# Patient Record
Sex: Male | Born: 1980 | Race: White | Hispanic: No | Marital: Single | Smoking: Current every day smoker
Health system: Southern US, Community
[De-identification: ages and names within clinical notes are randomized; demographics above are authoritative.]

## PROBLEM LIST (undated history)

## (undated) DIAGNOSIS — F1020 Alcohol dependence, uncomplicated: Secondary | ICD-10-CM

## (undated) DIAGNOSIS — I8500 Esophageal varices without bleeding: Secondary | ICD-10-CM

## (undated) DIAGNOSIS — F17200 Nicotine dependence, unspecified, uncomplicated: Secondary | ICD-10-CM

## (undated) DIAGNOSIS — K7030 Alcoholic cirrhosis of liver without ascites: Secondary | ICD-10-CM

## (undated) HISTORY — DX: Alcoholic cirrhosis of liver without ascites: K70.30

## (undated) HISTORY — PX: NO PAST SURGERIES: SHX2092

## (undated) HISTORY — DX: Esophageal varices without bleeding: I85.00

---

## 2009-02-20 ENCOUNTER — Ambulatory Visit: Payer: Self-pay | Admitting: Diagnostic Radiology

## 2009-02-20 ENCOUNTER — Emergency Department (HOSPITAL_BASED_OUTPATIENT_CLINIC_OR_DEPARTMENT_OTHER): Admission: EM | Admit: 2009-02-20 | Discharge: 2009-02-20 | Payer: Self-pay | Admitting: Emergency Medicine

## 2021-06-11 ENCOUNTER — Emergency Department (HOSPITAL_COMMUNITY): Payer: 59

## 2021-06-11 ENCOUNTER — Encounter (HOSPITAL_COMMUNITY): Payer: Self-pay | Admitting: Emergency Medicine

## 2021-06-11 ENCOUNTER — Other Ambulatory Visit: Payer: Self-pay

## 2021-06-11 ENCOUNTER — Inpatient Hospital Stay (HOSPITAL_COMMUNITY)
Admission: EM | Admit: 2021-06-11 | Discharge: 2021-06-15 | DRG: 433 | Disposition: A | Discharge status: Home / Self Care | Payer: 59 | Attending: Internal Medicine | Admitting: Internal Medicine

## 2021-06-11 DIAGNOSIS — F17200 Nicotine dependence, unspecified, uncomplicated: Secondary | ICD-10-CM | POA: Diagnosis not present

## 2021-06-11 DIAGNOSIS — R9431 Abnormal electrocardiogram [ECG] [EKG]: Secondary | ICD-10-CM | POA: Diagnosis present

## 2021-06-11 DIAGNOSIS — E860 Dehydration: Secondary | ICD-10-CM | POA: Diagnosis present

## 2021-06-11 DIAGNOSIS — D539 Nutritional anemia, unspecified: Secondary | ICD-10-CM | POA: Diagnosis not present

## 2021-06-11 DIAGNOSIS — D689 Coagulation defect, unspecified: Secondary | ICD-10-CM | POA: Diagnosis present

## 2021-06-11 DIAGNOSIS — K7040 Alcoholic hepatic failure without coma: Principal | ICD-10-CM | POA: Diagnosis present

## 2021-06-11 DIAGNOSIS — D72829 Elevated white blood cell count, unspecified: Secondary | ICD-10-CM | POA: Diagnosis present

## 2021-06-11 DIAGNOSIS — K7031 Alcoholic cirrhosis of liver with ascites: Secondary | ICD-10-CM | POA: Diagnosis present

## 2021-06-11 DIAGNOSIS — E1065 Type 1 diabetes mellitus with hyperglycemia: Secondary | ICD-10-CM | POA: Diagnosis not present

## 2021-06-11 DIAGNOSIS — K766 Portal hypertension: Secondary | ICD-10-CM | POA: Diagnosis not present

## 2021-06-11 DIAGNOSIS — K7011 Alcoholic hepatitis with ascites: Secondary | ICD-10-CM | POA: Diagnosis present

## 2021-06-11 DIAGNOSIS — E538 Deficiency of other specified B group vitamins: Secondary | ICD-10-CM | POA: Diagnosis present

## 2021-06-11 DIAGNOSIS — R5081 Fever presenting with conditions classified elsewhere: Secondary | ICD-10-CM

## 2021-06-11 DIAGNOSIS — I864 Gastric varices: Secondary | ICD-10-CM | POA: Diagnosis present

## 2021-06-11 DIAGNOSIS — E872 Acidosis, unspecified: Secondary | ICD-10-CM | POA: Diagnosis not present

## 2021-06-11 DIAGNOSIS — E8770 Fluid overload, unspecified: Secondary | ICD-10-CM | POA: Diagnosis present

## 2021-06-11 DIAGNOSIS — R69 Illness, unspecified: Secondary | ICD-10-CM | POA: Diagnosis not present

## 2021-06-11 DIAGNOSIS — K7200 Acute and subacute hepatic failure without coma: Secondary | ICD-10-CM | POA: Diagnosis present

## 2021-06-11 DIAGNOSIS — Z880 Allergy status to penicillin: Secondary | ICD-10-CM | POA: Diagnosis not present

## 2021-06-11 DIAGNOSIS — R000 Tachycardia, unspecified: Secondary | ICD-10-CM | POA: Diagnosis not present

## 2021-06-11 DIAGNOSIS — Z91013 Allergy to seafood: Secondary | ICD-10-CM | POA: Diagnosis not present

## 2021-06-11 DIAGNOSIS — Z20822 Contact with and (suspected) exposure to covid-19: Secondary | ICD-10-CM | POA: Diagnosis not present

## 2021-06-11 DIAGNOSIS — I700 Atherosclerosis of aorta: Secondary | ICD-10-CM | POA: Diagnosis not present

## 2021-06-11 DIAGNOSIS — I8510 Secondary esophageal varices without bleeding: Secondary | ICD-10-CM | POA: Diagnosis present

## 2021-06-11 DIAGNOSIS — F17210 Nicotine dependence, cigarettes, uncomplicated: Secondary | ICD-10-CM | POA: Diagnosis present

## 2021-06-11 DIAGNOSIS — D6959 Other secondary thrombocytopenia: Secondary | ICD-10-CM | POA: Diagnosis present

## 2021-06-11 DIAGNOSIS — R17 Unspecified jaundice: Secondary | ICD-10-CM

## 2021-06-11 DIAGNOSIS — F1020 Alcohol dependence, uncomplicated: Secondary | ICD-10-CM | POA: Diagnosis present

## 2021-06-11 DIAGNOSIS — R188 Other ascites: Secondary | ICD-10-CM | POA: Diagnosis not present

## 2021-06-11 DIAGNOSIS — R509 Fever, unspecified: Secondary | ICD-10-CM | POA: Diagnosis present

## 2021-06-11 DIAGNOSIS — Z91011 Allergy to milk products: Secondary | ICD-10-CM

## 2021-06-11 DIAGNOSIS — E871 Hypo-osmolality and hyponatremia: Secondary | ICD-10-CM | POA: Diagnosis not present

## 2021-06-11 DIAGNOSIS — K760 Fatty (change of) liver, not elsewhere classified: Secondary | ICD-10-CM | POA: Diagnosis not present

## 2021-06-11 DIAGNOSIS — R1011 Right upper quadrant pain: Secondary | ICD-10-CM | POA: Diagnosis not present

## 2021-06-11 DIAGNOSIS — E876 Hypokalemia: Secondary | ICD-10-CM | POA: Diagnosis not present

## 2021-06-11 DIAGNOSIS — Z79899 Other long term (current) drug therapy: Secondary | ICD-10-CM | POA: Diagnosis not present

## 2021-06-11 DIAGNOSIS — R160 Hepatomegaly, not elsewhere classified: Secondary | ICD-10-CM | POA: Diagnosis not present

## 2021-06-11 HISTORY — DX: Nicotine dependence, unspecified, uncomplicated: F17.200

## 2021-06-11 HISTORY — DX: Alcohol dependence, uncomplicated: F10.20

## 2021-06-11 LAB — ETHANOL: Alcohol, Ethyl (B): <10 mg/dL (ref <10)

## 2021-06-11 LAB — RAPID URINE DRUG SCREEN, HOSP PERFORMED
Amphetamines: NOT DETECTED
Barbiturates: NOT DETECTED
Benzodiazepines: NOT DETECTED
Cocaine: NOT DETECTED
Opiates: NOT DETECTED
Tetrahydrocannabinol: NOT DETECTED

## 2021-06-11 LAB — BASIC METABOLIC PANEL
Anion gap: 13 (ref 5–15)
BUN: 5 mg/dL — ABNORMAL LOW (ref 6–20)
CO2: 34 mmol/L — ABNORMAL HIGH (ref 22–32)
Calcium: 7.7 mg/dL — ABNORMAL LOW (ref 8.9–10.3)
Chloride: 80 mmol/L — ABNORMAL LOW (ref 98–111)
Creatinine, Ser: 0.71 mg/dL (ref 0.61–1.24)
GFR, Estimated: >60 mL/min (ref >60)
Glucose, Bld: 120 mg/dL — ABNORMAL HIGH (ref 70–99)
Potassium: 2.4 mmol/L — CL (ref 3.5–5.1)
Sodium: 127 mmol/L — ABNORMAL LOW (ref 135–145)

## 2021-06-11 LAB — URINALYSIS, ROUTINE W REFLEX MICROSCOPIC
Cellular Cast, UA: 40
Glucose, UA: 50 mg/dL — AB
Ketones, ur: NEGATIVE mg/dL
Leukocytes,Ua: NEGATIVE
Nitrite: NEGATIVE
Protein, ur: 30 mg/dL — AB
Specific Gravity, Urine: 1.021 (ref 1.005–1.030)
pH: 5 (ref 5.0–8.0)

## 2021-06-11 LAB — CBC WITH DIFFERENTIAL/PLATELET
Abs Immature Granulocytes: 0.23 10*3/uL — ABNORMAL HIGH (ref 0.00–0.07)
Basophils Absolute: 0 10*3/uL (ref 0.0–0.1)
Basophils Relative: 0 %
Eosinophils Absolute: 0 10*3/uL (ref 0.0–0.5)
Eosinophils Relative: 0 %
HCT: 31.5 % — ABNORMAL LOW (ref 39.0–52.0)
Hemoglobin: 11.5 g/dL — ABNORMAL LOW (ref 13.0–17.0)
Immature Granulocytes: 1 %
Lymphocytes Relative: 6 %
Lymphs Abs: 1.1 10*3/uL (ref 0.7–4.0)
MCH: 35.2 pg — ABNORMAL HIGH (ref 26.0–34.0)
MCHC: 36.5 g/dL — ABNORMAL HIGH (ref 30.0–36.0)
MCV: 96.3 fL (ref 80.0–100.0)
Monocytes Absolute: 1.2 10*3/uL — ABNORMAL HIGH (ref 0.1–1.0)
Monocytes Relative: 6 %
Neutro Abs: 15.7 10*3/uL — ABNORMAL HIGH (ref 1.7–7.7)
Neutrophils Relative %: 87 %
Platelets: 110 10*3/uL — ABNORMAL LOW (ref 150–400)
RBC: 3.27 MIL/uL — ABNORMAL LOW (ref 4.22–5.81)
RDW: 14.9 % (ref 11.5–15.5)
WBC: 18.2 10*3/uL — ABNORMAL HIGH (ref 4.0–10.5)
nRBC: 0.2 % (ref 0.0–0.2)

## 2021-06-11 LAB — HEPATITIS PANEL, ACUTE
HCV Ab: NONREACTIVE
Hep A IgM: NONREACTIVE
Hep B C IgM: NONREACTIVE
Hepatitis B Surface Ag: NONREACTIVE

## 2021-06-11 LAB — COMPREHENSIVE METABOLIC PANEL
ALT: 74 U/L — ABNORMAL HIGH (ref 0–44)
AST: 196 U/L — ABNORMAL HIGH (ref 15–41)
Albumin: 2.2 g/dL — ABNORMAL LOW (ref 3.5–5.0)
Alkaline Phosphatase: 398 U/L — ABNORMAL HIGH (ref 38–126)
Anion gap: 18 — ABNORMAL HIGH (ref 5–15)
BUN: <5 mg/dL — ABNORMAL LOW (ref 6–20)
CO2: 33 mmol/L — ABNORMAL HIGH (ref 22–32)
Calcium: 8.8 mg/dL — ABNORMAL LOW (ref 8.9–10.3)
Chloride: 75 mmol/L — ABNORMAL LOW (ref 98–111)
Creatinine, Ser: 1.09 mg/dL (ref 0.61–1.24)
GFR, Estimated: >60 mL/min (ref >60)
Glucose, Bld: 152 mg/dL — ABNORMAL HIGH (ref 70–99)
Potassium: <2 mmol/L — CL (ref 3.5–5.1)
Sodium: 126 mmol/L — ABNORMAL LOW (ref 135–145)
Total Bilirubin: 24.5 mg/dL (ref 0.3–1.2)
Total Protein: 6.8 g/dL (ref 6.5–8.1)

## 2021-06-11 LAB — HIV ANTIBODY (ROUTINE TESTING W REFLEX): HIV Screen 4th Generation wRfx: NONREACTIVE

## 2021-06-11 LAB — RESP PANEL BY RT-PCR (FLU A&B, COVID) ARPGX2
Influenza A by PCR: NEGATIVE
Influenza B by PCR: NEGATIVE
SARS Coronavirus 2 by RT PCR: NEGATIVE

## 2021-06-11 LAB — PROTIME-INR
INR: 1.3 — ABNORMAL HIGH (ref 0.8–1.2)
INR: 1.3 — ABNORMAL HIGH (ref 0.8–1.2)
Prothrombin Time: 15.9 seconds — ABNORMAL HIGH (ref 11.4–15.2)
Prothrombin Time: 16 seconds — ABNORMAL HIGH (ref 11.4–15.2)

## 2021-06-11 LAB — MAGNESIUM: Magnesium: 1.2 mg/dL — ABNORMAL LOW (ref 1.7–2.4)

## 2021-06-11 LAB — ACETAMINOPHEN LEVEL: Acetaminophen (Tylenol), Serum: <10 ug/mL — ABNORMAL LOW (ref 10–30)

## 2021-06-11 LAB — LIPASE, BLOOD: Lipase: 41 U/L (ref 11–51)

## 2021-06-11 LAB — LACTIC ACID, PLASMA
Lactic Acid, Venous: 4.4 mmol/L (ref 0.5–1.9)
Lactic Acid, Venous: 3.4 mmol/L (ref 0.5–1.9)
Lactic Acid, Venous: 1.9 mmol/L (ref 0.5–1.9)
Lactic Acid, Venous: 2.2 mmol/L (ref 0.5–1.9)

## 2021-06-11 MED ORDER — HYDRALAZINE HCL 20 MG/ML IJ SOLN: 5.0000 mg | INTRAMUSCULAR | Status: DC | PRN

## 2021-06-11 MED ORDER — PHYTONADIONE 5 MG PO TABS
10.0000 mg | ORAL_TABLET | Freq: Once | ORAL | Status: AC
  Administered 2021-06-11: 10 mg via ORAL
  Filled 2021-06-11: qty 2

## 2021-06-11 MED ORDER — POTASSIUM CHLORIDE 10 MEQ/100ML IV SOLN
10.0000 meq | INTRAVENOUS | Status: AC
  Administered 2021-06-11: 10 meq via INTRAVENOUS

## 2021-06-11 MED ORDER — OXYCODONE HCL 5 MG PO TABS
5.0000 mg | ORAL_TABLET | ORAL | Status: DC | PRN
  Administered 2021-06-11 – 2021-06-14 (×4): 5 mg via ORAL
  Filled 2021-06-11 (×4): qty 1

## 2021-06-11 MED ORDER — THIAMINE HCL 100 MG/ML IJ SOLN: 100.0000 mg | Freq: Every day | INTRAMUSCULAR | Status: DC

## 2021-06-11 MED ORDER — MAGNESIUM SULFATE 2 GM/50ML IV SOLN
2.0000 g | Freq: Once | INTRAVENOUS | Status: AC
  Administered 2021-06-11: 2 g via INTRAVENOUS
  Filled 2021-06-11: qty 50

## 2021-06-11 MED ORDER — ONDANSETRON HCL 4 MG PO TABS: 4.0000 mg | ORAL_TABLET | Freq: Four times a day (QID) | ORAL | Status: DC | PRN

## 2021-06-11 MED ORDER — IOHEXOL 350 MG/ML SOLN
80.0000 mL | Freq: Once | INTRAVENOUS | Status: AC | PRN
  Administered 2021-06-11: 80 mL via INTRAVENOUS

## 2021-06-11 MED ORDER — DOCUSATE SODIUM 100 MG PO CAPS
100.0000 mg | ORAL_CAPSULE | Freq: Two times a day (BID) | ORAL | Status: DC
  Administered 2021-06-11 – 2021-06-13 (×6): 100 mg via ORAL
  Filled 2021-06-11 (×6): qty 1

## 2021-06-11 MED ORDER — SODIUM CHLORIDE 0.9 % IV SOLN: INTRAVENOUS | Status: DC

## 2021-06-11 MED ORDER — FOLIC ACID 1 MG PO TABS
1.0000 mg | ORAL_TABLET | Freq: Every day | ORAL | Status: DC
  Administered 2021-06-11 – 2021-06-15 (×5): 1 mg via ORAL
  Filled 2021-06-11 (×5): qty 1

## 2021-06-11 MED ORDER — SODIUM CHLORIDE 0.9 % IV SOLN
2.0000 g | INTRAVENOUS | Status: DC
  Administered 2021-06-11 – 2021-06-15 (×5): 2 g via INTRAVENOUS
  Filled 2021-06-11 (×5): qty 20

## 2021-06-11 MED ORDER — ADULT MULTIVITAMIN W/MINERALS CH
1.0000 | ORAL_TABLET | Freq: Every day | ORAL | Status: DC
  Administered 2021-06-11 – 2021-06-15 (×5): 1 via ORAL
  Filled 2021-06-11 (×5): qty 1

## 2021-06-11 MED ORDER — POLYETHYLENE GLYCOL 3350 17 G PO PACK: 17.0000 g | PACK | Freq: Every day | ORAL | Status: DC | PRN

## 2021-06-11 MED ORDER — CIPROFLOXACIN IN D5W 400 MG/200ML IV SOLN
400.0000 mg | Freq: Two times a day (BID) | INTRAVENOUS | Status: DC
  Administered 2021-06-11: 400 mg via INTRAVENOUS
  Filled 2021-06-11: qty 200

## 2021-06-11 MED ORDER — LORAZEPAM 2 MG/ML IJ SOLN: 1.0000 mg | INTRAMUSCULAR | Status: DC | PRN

## 2021-06-11 MED ORDER — ONDANSETRON HCL 4 MG/2ML IJ SOLN
4.0000 mg | Freq: Four times a day (QID) | INTRAMUSCULAR | Status: DC | PRN
Start: 2021-06-11 — End: 2021-06-15

## 2021-06-11 MED ORDER — SODIUM CHLORIDE 0.9 % IV BOLUS (SEPSIS)
1000.0000 mL | Freq: Once | INTRAVENOUS | Status: AC
  Administered 2021-06-11: 1000 mL via INTRAVENOUS

## 2021-06-11 MED ORDER — LORAZEPAM 1 MG PO TABS: 1.0000 mg | ORAL_TABLET | ORAL | Status: DC | PRN

## 2021-06-11 MED ORDER — NICOTINE 14 MG/24HR TD PT24
14.0000 mg | MEDICATED_PATCH | Freq: Every day | TRANSDERMAL | Status: DC
  Administered 2021-06-11 – 2021-06-15 (×5): 14 mg via TRANSDERMAL
  Filled 2021-06-11 (×5): qty 1

## 2021-06-11 MED ORDER — POTASSIUM CHLORIDE CRYS ER 20 MEQ PO TBCR
40.0000 meq | EXTENDED_RELEASE_TABLET | Freq: Once | ORAL | Status: AC
  Administered 2021-06-11: 40 meq via ORAL
  Filled 2021-06-11: qty 2

## 2021-06-11 MED ORDER — LACTATED RINGERS IV SOLN: INTRAVENOUS | Status: DC

## 2021-06-11 MED ORDER — THIAMINE HCL 100 MG PO TABS
100.0000 mg | ORAL_TABLET | Freq: Every day | ORAL | Status: DC
  Administered 2021-06-11 – 2021-06-15 (×5): 100 mg via ORAL
  Filled 2021-06-11 (×5): qty 1

## 2021-06-11 MED ORDER — METRONIDAZOLE 500 MG/100ML IV SOLN
500.0000 mg | Freq: Two times a day (BID) | INTRAVENOUS | Status: DC
  Administered 2021-06-11: 500 mg via INTRAVENOUS
  Filled 2021-06-11: qty 100

## 2021-06-11 MED ORDER — BISACODYL 5 MG PO TBEC: 5.0000 mg | DELAYED_RELEASE_TABLET | Freq: Every day | ORAL | Status: DC | PRN

## 2021-06-11 MED ORDER — SODIUM CHLORIDE 0.9% FLUSH
3.0000 mL | Freq: Two times a day (BID) | INTRAVENOUS | Status: DC
  Administered 2021-06-11 – 2021-06-15 (×7): 3 mL via INTRAVENOUS

## 2021-06-11 MED ORDER — POTASSIUM CHLORIDE 10 MEQ/100ML IV SOLN
10.0000 meq | INTRAVENOUS | Status: AC
  Administered 2021-06-11 (×5): 10 meq via INTRAVENOUS
  Filled 2021-06-11 (×6): qty 100

## 2021-06-11 NOTE — Assessment & Plan Note (Signed)
-  Encourage cessation.   °-This was discussed with the patient and should be reviewed on an ongoing basis.   °-Patch ordered at patient request. °

## 2021-06-11 NOTE — ED Triage Notes (Signed)
Patient with back pain/kidney pain and right flank pain.  Patient did have nausea and vomiting on Monday.  Patient states that this happened after drinking on Super bowl Sunday.  Patient is jaundiced at this time, skin is yellow and sclarea are yellow.  Patient does drink every day.  Patient drinks beer and has shots of hard liquor.  Patient last drank on Sunday night.

## 2021-06-11 NOTE — Consult Note (Signed)
[facility] Consult: 8:57 AM 06/11/2021  LOS: 0 days    Referring Provider: Dr Lorin Mercy  Primary Care Physician:  Pcp, No Previous interactions with Thomas Snyder at [facility]. Primary Gastroenterologist:  unassigned     Reason for Consultation:  ETOH hepatitis, cirrhosis, portal htn.     HPI: Thomas Snyder is a 41 y.o. male.  EMH epidermoid cyst.  Elevated LFTs tribute into alcohol 2020 with hepatitis panel negative, GGT 468.  Did not have liver imaging.  Glucose intolerance, DM 1 not on meds..  Neuropathy.  Low vitamin D 2020.  Elevated triglycerides 192 in 2020.  No prior GI or hepatology involvement.  No prior orthopedic, abdominal/pelvic surgeries  Presented to triage early yesterday morning.  Having back/right flank pain.  Nausea and vomiting nonbloody material early in the week after drinking more heavily than usual on Super Bowl Sunday.  Noted to be jaundiced.  Takes omeprazole which controls heartburn.  Periodic dark urine for several weeks if not months.  For last several days, felt well but this is nonspecific, denies abdominal pain.  No current nausea though over several months he periodically vomits when he "eats too fast/too much food at once".  Admits to daily drinking of a beer and 5 shots of liquor.  On weekends he can drink 2-3 times that amount.  His longest sobriety in recent years has been 10 days, he has been advised he should stop drinking by family.  Has never suffered from hallucinations, seizures during longer periods of sobriety.  Occasionally has swelling mostly at the sides of his abdomen but not in his lower extremities or scrotum.  Weight stable.  Starting Thursday, 3 days ago, coworker noted jaundice.  He showed up in the ED on Friday morning for evaluation.  Denies black,  bloody stools, coffee-ground or hematemesis.  Uses 1-2 regular strength Tylenol several days a week to address stiffness in his hands when he is at work.  BPs 150s over 90s at arrival, currently 120s over 80.  Heart rate as high as 134 at arrival, currently 121.  No fever.  Oxygen sats mid 90s on room air.  Lab derangements include hyponatremia 126.  Potassium less than 2.  Hypochloremia.  Glucose 152.  BUN actually low with normal creatinine.  T. bili 24.5.  Alk phos 398.  AST/ALT 196/74. WBCs 18.2.  Hb 11.5, MCV 96.  Platelets 110.  INR 1.3 Discrim Fx score 35.   EtOH level less than 10, last drink was Super Bowl Sunday, 6 days ago. Serum lactate 4.4 .. 3.4.    Abdominal ultrasound CTAP with contrast: Cirrhosis/hepatic steatosis, marked hepatomegaly changes of portal hypertension with recanalized umbilicus vein, small esophageal and gastric varices, small volume ascites.  Edema in the proximal colon with more significant edema in the mid and distal sigmoid and rectum, findings nonspecific but may represent colitis versus hepatic colopathy.  Aortic atherosclerosis. Abd US shows nonspecific gallbladder wall thickening, no cholelithiasis.  Fatty liver, hepatomegaly, cirrhosis.  No flow in the main PV may be sequela of extensive  portal hypertension but PV thrombosis not excluded.  Perihepatic ascites that may be the source for gallbladder wall thickening.  Single, lives alone.  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  Parents live in [place], he has a sister who lives in [place].  Has had 2 DUIs, the last was a 4 or 5 years ago. Family history T2DM.  Father has had several colonoscopies but the patient is not sure that he has any specific pathology.  No family history of alcohol use disorder or liver disease.     Prior to Admission medications   Medication Sig Start Date End Date Taking? Authorizing Provider  acetaminophen (TYLENOL) 325 MG tablet Take 650 mg by mouth every 6 (six)  hours as needed for moderate pain or headache.   Yes [provider]  calcium carbonate (TUMS - DOSED IN MG ELEMENTAL CALCIUM) 500 MG chewable tablet Chew 1 tablet by mouth daily as needed for indigestion or heartburn.   Yes [provider]  loratadine (CLARITIN) 10 MG tablet Take 10 mg by mouth daily.   Yes [provider]  omeprazole (PRILOSEC OTC) 20 MG tablet Take 20 mg by mouth daily.   Yes [provider]    Scheduled Meds:  Infusions:  sodium chloride 150 mL/hr at 06/11/21 0726   ciprofloxacin Stopped (06/11/21 0808)   metronidazole Stopped (06/11/21 0630)   PRN Meds:    Allergies as of 06/11/2021 - Review Complete 06/11/2021  Allergen Reaction Noted   Milk-related compounds  06/11/2021   Penicillins  06/11/2021   Shellfish allergy  07/03/2018    History reviewed. No pertinent family history.  Social History   Socioeconomic History   Marital status: Single    Spouse name: Not on file   Number of children: Not on file   Years of education: Not on file   Highest education level: Not on file  Occupational History   Not on file  Tobacco Use   Smoking status: Every Day    Types: Cigarettes   Smokeless tobacco: Never  Substance and Sexual Activity   Alcohol use: Yes    Alcohol/week: 11.0 standard drinks    Types: 6 Cans of beer, 5 Shots of liquor per week    Comment: drinks every day   Drug use: Not on file   Sexual activity: Not on file  Other Topics Concern   Not on file  Social History Narrative   Not on file   Social Determinants of Health   Financial Resource Strain: Not on file  Food Insecurity: Not on file  Transportation Needs: Not on file  Physical Activity: Not on file  Stress: Not on file  Social Connections: Not on file  Intimate Partner Violence: Not on file    REVIEW OF SYSTEMS: Constitutional: Little bit tired but nothing profound. ENT:  No nose bleeds Pulm: Denies shortness of breath.  No  cough. CV:  No palpitations, no LE edema.  No angina GU:  No hematuria, no frequency GI: See HPI. Heme: Denies unusual or excessive bleeding or bruising. Transfusions: None Neuro:  No headaches, no peripheral tingling or numbness.  No syncope, no seizures. Derm:  No itching, no rash or sores.  Endocrine:  No sweats or chills.  No polyuria or dysuria Immunization: Not queried. Travel:  Not queried.   PHYSICAL EXAM: Vital signs in last 24 hours: Vitals:   06/11/21 0501 06/11/21 0730  BP: (!) 132/92 128/80  Pulse: (!) 125 (!) 121  Resp: 18  17  Temp: 99.7 F (37.6 C)   SpO2: 96% 95%   Wt Readings from Last 3 Encounters:  No data found for Wt    General: Patient is jaundiced and icteric.  Comfortable.  His jaundice makes him look ill but otherwise does not look acutely ill. Head: No facial asymmetry or swelling.  No signs of head trauma. Eyes: Sclera icteric.  Conjunctiva pink. Ears: No hearing deficit Nose: No congestion or discharge Mouth: Fair dentition.  Tongue midline.  Mucosa pink, moist, clear. Neck: No JVD, no masses, no thyromegaly Lungs: Some fine crackles in the bases otherwise clear.  No labored breathing or cough Heart: Tacky.  No MRG.  S1, S2 present Abdomen: Somewhat tense, moderately distended/protuberant.  No tenderness.  Hepatomegaly but liver nontender.  No masses, hernias, bruits..   Rectal: Deferred. Musc/Skeltl: No joint redness.  Slight swelling in the hands. Extremities: Nonpitting, minor swelling in ankles/feet. Neurologic: Alert.  Oriented x3.  No asterixis.  Moves all 4 limbs with full strength. Skin: Jaundiced.  Reticular pattern of erythema across his upper thorax.  Redness at the skin around C7 Nodes: No cervical adenopathy Psych: Cooperative, calm, pleasant.  Intake/Output from previous day: No intake/output data recorded. Intake/Output this shift: Total I/O In: 1200 [IV Piggyback:1200] Out: 350 [Urine:350]  LAB RESULTS: Recent Labs     06/11/21 0249  WBC 18.2*  HGB 11.5*  HCT 31.5*  PLT 110*   BMET Lab Results  Component Value Date   NA 126 (L) 06/11/2021   K <2.0 (LL) 06/11/2021   CL 75 (L) 06/11/2021   CO2 33 (H) 06/11/2021   GLUCOSE 152 (H) 06/11/2021   BUN <5 (L) 06/11/2021   CREATININE 1.09 06/11/2021   CALCIUM 8.8 (L) 06/11/2021   LFT Recent Labs    06/11/21 0249  PROT 6.8  ALBUMIN 2.2*  AST 196*  ALT 74*  ALKPHOS 398*  BILITOT 24.5*   PT/INR Lab Results  Component Value Date   INR 1.3 (H) 06/11/2021   Hepatitis Panel No results for input(s): HEPBSAG, HCVAB, HEPAIGM, HEPBIGM in the last 72 hours. C-Diff No components found for: CDIFF Lipase     Component Value Date/Time   LIPASE 41 06/11/2021 0249    Drugs of Abuse     Component Value Date/Time   LABOPIA NONE DETECTED 06/11/2021 0227   COCAINSCRNUR NONE DETECTED 06/11/2021 0227   LABBENZ NONE DETECTED 06/11/2021 0227   AMPHETMU NONE DETECTED 06/11/2021 0227   THCU NONE DETECTED 06/11/2021 0227   LABBARB NONE DETECTED 06/11/2021 0227     RADIOLOGY STUDIES: CT ABDOMEN PELVIS W CONTRAST  Result Date: 06/11/2021 CLINICAL DATA:  Liver failure. EXAM: CT ABDOMEN AND PELVIS WITH CONTRAST TECHNIQUE: Multidetector CT imaging of the abdomen and pelvis was performed using the standard protocol following bolus administration of intravenous contrast. RADIATION DOSE REDUCTION: This exam was performed according to the departmental dose-optimization program which includes automated exposure control, adjustment of the mA and/or kV according to patient size and/or use of iterative reconstruction technique. CONTRAST:  79m OMNIPAQUE IOHEXOL 350 MG/ML SOLN COMPARISON:  None. FINDINGS: Lower chest: No acute abnormality. Hepatobiliary: Hepatomegaly with diffuse hepatic steatosis. Atrophic medial segment of left hepatic lobe is favored to represent confluent fibrosis in the setting of cirrhosis. Recanalization of the umbilical vein is identified.  Gallbladder appears partially decompressed. Pancreas: Unremarkable. No pancreatic ductal dilatation or surrounding inflammatory changes. Spleen: Mild splenomegaly measuring 13.1 cm in length. No focal splenic lesion. Adrenals/Urinary Tract: Normal adrenal glands. The kidneys are  unremarkable. No nephrolithiasis or hydronephrosis. Urinary bladder appears normal. Stomach/Bowel: Stomach appears within normal limits. The appendix is visualized and is normal. There is mild circumferential bowel wall edema involving the proximal colon. Marked edema is noted involving the mid and distal sigmoid colon and rectum. No pathologic dilatation of the large or small bowel loops. Vascular/Lymphatic: Aortic atherosclerosis. The upper abdominal vascularity is patent including the intra and extrahepatic portal vein. Small esophageal and gastric varices identified. Recanalization of the umbilical vein. No abdominopelvic adenopathy identified. No abdominopelvic adenopathy. Reproductive: Prostate is unremarkable. Other: Small volume of perihepatic, lower abdominal and pelvic ascites. No discrete fluid collections. Musculoskeletal: No acute or significant osseous findings. IMPRESSION: 1. Morphologic features of the liver compatible with cirrhosis. There is a hepatic steatosis and marked hepatomegaly. Confluent fibrosis of the medial segment of left hepatic lobe noted. 2. Stigmata of portal venous hypertension with recanalized umbilical vein, small esophageal and gastric varices, and small volume of ascites. 3. Mild circumferential bowel wall edema involving the proximal colon with marked edema involving the mid and distal sigmoid colon and rectum. Findings are nonspecific and may reflect underlying colitis versus hepatic colopathy 4. Aortic Atherosclerosis (ICD10-I70.0). Electronically Signed   By: Kerby Moors M.D.   On: 06/11/2021 07:47   US Abdomen Limited  Result Date: 06/11/2021 CLINICAL DATA:  Right upper quadrant pain. EXAM:  ULTRASOUND ABDOMEN LIMITED RIGHT UPPER QUADRANT COMPARISON:  None. FINDINGS: Gallbladder: No gallstones are visualized. The gallbladder wall is thickened and measures 4.8 mm in thickness. No sonographic Murphy sign noted by sonographer. Common bile duct: Diameter: 3.1 mm Liver: The liver is enlarged and measures approximately 23.2 cm in length. No focal lesion identified. The liver parenchyma is nodular in contour and diffusely increased in echogenicity. No flow is identified within the portal vein on color Doppler evaluation. Other: A mild amount of ascites is seen adjacent to the liver. IMPRESSION: 1. Gallbladder wall thickening in the absence of cholelithiasis. 2. Hepatic steatosis and hepatomegaly with additional findings suggestive of hepatic cirrhosis. 3. No flow within the main portal vein which may represent sequelae associated with extensive portal hypertension. Correlation with contrast-enhanced abdomen pelvis CT is recommended, as portal vein thrombosis cannot be excluded. 4. Perihepatic ascites which may represent the source of the previously noted gallbladder wall thickening. Electronically Signed   By: Virgina Norfolk M.D.   On: 06/11/2021 03:15      IMPRESSION:   Acute alcoholic hepatitis with newly diagnosed underlying cirrhosis is decompensated.  Discriminant function score 35.  Nonspecific gallbladder wall thickening with no symptoms suggestive of cholecystitis.  General surgery has evaluated the patient and concurs that gallbladder wall thickening is sequela of his liver disease.  Changes of portal hypertension including esophageal, gastric varices.  Small volume ascites.  Leukocytosis.  Along with elevated lactate, tachycardia meets criteria for sepsis.  Blood cultures pending.  Metronidazole, Cipro initiated.  Coagulopathy.  Thrombocytopenia  Hyponatremia  Severe hypokalemia.  Colonic edema, rule out portal colopathy.  No symptoms to suggest colitis either acute or  chronic.    DM 2.  Never treated w meds.      Macrocytic anemia.      ETOH use disorder.  Says last drink was 6 d ago.  With tachycardia, concern for withdrawal.    PLAN:     Meets criteria for prednisolone treatment given elevated discriminant function score but with question of underlying infectious process, hold off on adding prednisolone.        Paracentesis if feasible though  from the small volume of ascites described, this may not be feasible.  Acute hepatitis serologies ordered.  In addition ordered ANA, AMA, smooth muscle antibody, mitochondrial antibody, ceruloplasmin, ferritin, B12, folate  Radiology reviewed imaging and barriers insufficient ascites for paracentesis.  Eventually will need EGD for evaluation/management of varices.  Hold off on initiating nonselective beta-blocker.  Needs absolute complete alcohol abstinence.  Azucena Freed  06/11/2021, 8:57 AM Phone [phone]

## 2021-06-11 NOTE — Sepsis Progress Note (Signed)
Notified bedside nurse of need for blood cultures, repeat LA and request for another liter of fluid based off of weight reported at 79.5 kg.

## 2021-06-11 NOTE — Assessment & Plan Note (Signed)
-  Patient with chronic ETOH dependence -Denies history of withdrawal including seizures, ICU admissions, DTs -Patient is exhibiting active s/sx of withdrawal as evidenced by his persistent tachycardia -He is at high risk for complications of withdrawal including seizures, DTs -Will admit to progressive care at this time -CIWA protocol -Folate, thiamine, and MVI ordered -Will provide symptom-triggered BZD (ativan per CIWA protocol) only since the patient is able to communicate; is not showing current signs of delirium; and has no history of severe withdrawal. -TOC team consult for possible inpatient treatment/substance abuse education  Thrombocytopenia - Due to bone marrow suppression from alcohol abuse - Monitor daily platelet count

## 2021-06-11 NOTE — ED Notes (Signed)
Critcial Lactic results given to Dr. Leonette Monarch

## 2021-06-11 NOTE — Assessment & Plan Note (Signed)
-  Likely associated with marked electrolyte abnormalities and anticipate improvement once this is normalized -Will attempt to avoid QT-prolonging medications such as PPI, nausea meds, SSRIs -Repeat EKG in AM

## 2021-06-11 NOTE — H&P (Signed)
History and Physical    Patient: Thomas Snyder ZDG:[id number] DOB: February 16, 1981 DOA: 06/11/2021 DOS: the patient was seen and examined on 06/11/2021 PCP: Pcp, No  Patient coming from: Home - lives alone; NOK: Mother, Thomas Snyder, [phone]   Chief Complaint: Jaundice  HPI: Thomas Snyder is a 41 y.o. male with medical history significant of alcohol dependence presenting with back and flank pain, jaundice.  He reports that he drinks daily, usually 1 beer and about 4-5 shots of bourbon (it takes him about a week to drink a bottle).  During the Super Bowl, he drank a lot more than usual.  He estimates maybe 10 beers and 10-12 shots.  He awoke the next day with back and flank pain.  The following day it was a little better and so he just continued to watch it.  He did not drink again after 2/12.  He went to work on 2/16 and noticed that he was jaundiced on his face, chest, and eyes.  His coworkers also noticed this and encouraged him to come to the ER.    He has h/o mild LFT elevated in 01/2019 (AP 150, AST 63, ALT 93, normal bili).   LFTs were also mildly elevated in 06/2018 and this was "assumed to be related to alcohol since hepatitis panel was negative."  Alcohol use was documented as 1-2 per session, 4 or more times a week.  Liver tests were actually improving but liver US was ordered.  He does not appear to have gone for this test.    ER Course:    Acute liver failure from alcohol.  CT pending, ?portal vein thrombosis.  New ascites.  PCCM consulted, surgery will see re: GB wall thickening, GI will consult.  Symptoms started after heavy Super Bowl drinking.  Given Cipro/Flagyl.     Review of Systems: As mentioned in the history of present illness. All other systems reviewed and are negative. Past Medical History:  Diagnosis Date   Alcohol dependence ([place])    Tobacco dependence    History reviewed. No pertinent surgical history. Social History:  reports that he has been smoking  cigarettes. He has been smoking an average of .5 packs per day. He has never used smokeless tobacco. He reports current alcohol use of about 42.0 standard drinks per week. He reports that he does not currently use drugs.  Allergies  Allergen Reactions   Milk-Related Compounds    Penicillins    Shellfish Allergy     Other reaction(s): Other (See Comments) Rash     History reviewed. No pertinent family history.  Prior to Admission medications   Medication Sig Start Date End Date Taking? Authorizing Provider  acetaminophen (TYLENOL) 325 MG tablet Take 650 mg by mouth every 6 (six) hours as needed for moderate pain or headache.   Yes [provider]  calcium carbonate (TUMS - DOSED IN MG ELEMENTAL CALCIUM) 500 MG chewable tablet Chew 1 tablet by mouth daily as needed for indigestion or heartburn.   Yes [provider]  loratadine (CLARITIN) 10 MG tablet Take 10 mg by mouth daily.   Yes [provider]  omeprazole (PRILOSEC OTC) 20 MG tablet Take 20 mg by mouth daily.   Yes [provider]    Physical Exam: Vitals:   06/11/21 1530 06/11/21 1600 06/11/21 1630 06/11/21 1640  BP: 136/88 123/89 127/83   Pulse: (!) 117 (!) 118 (!) 118   Resp: 11 18 15    Temp:    98.3 F (36.8  C)  TempSrc:      SpO2: 96% 96% 97%   Weight:      Height:       General:  Appears ill, very jaundiced    Eyes:  PERRL, EOMI, normal lids, +scleral icterus ENT:  grossly normal hearing, lips & tongue, mmm; poor dentition Neck:  no LAD, masses or thyromegaly Cardiovascular:  RR with tachycardia, no m/r/g. 2+ LE edema.  Respiratory:   CTA bilaterally with no wheezes/rales/rhonchi.  Normal respiratory effort. Abdomen:  gigantic hepatomegaly without obvious ascites Skin:  jaundice extending from the face to the lower abdomen Musculoskeletal:  grossly normal tone BUE/BLE, good ROM, no bony abnormality Psychiatric:  grossly normal mood and affect, speech fluent and appropriate,  AOx3 Neurologic:  CN 2-12 grossly intact, moves all extremities in coordinated fashion   Radiological Exams on Admission: Independently reviewed - see discussion in A/P where applicable  CT ABDOMEN PELVIS W CONTRAST  Result Date: 06/11/2021 CLINICAL DATA:  Liver failure. EXAM: CT ABDOMEN AND PELVIS WITH CONTRAST TECHNIQUE: Multidetector CT imaging of the abdomen and pelvis was performed using the standard protocol following bolus administration of intravenous contrast. RADIATION DOSE REDUCTION: This exam was performed according to the departmental dose-optimization program which includes automated exposure control, adjustment of the mA and/or kV according to patient size and/or use of iterative reconstruction technique. CONTRAST:  35mL OMNIPAQUE IOHEXOL 350 MG/ML SOLN COMPARISON:  None. FINDINGS: Lower chest: No acute abnormality. Hepatobiliary: Hepatomegaly with diffuse hepatic steatosis. Atrophic medial segment of left hepatic lobe is favored to represent confluent fibrosis in the setting of cirrhosis. Recanalization of the umbilical vein is identified. Gallbladder appears partially decompressed. Pancreas: Unremarkable. No pancreatic ductal dilatation or surrounding inflammatory changes. Spleen: Mild splenomegaly measuring 13.1 cm in length. No focal splenic lesion. Adrenals/Urinary Tract: Normal adrenal glands. The kidneys are unremarkable. No nephrolithiasis or hydronephrosis. Urinary bladder appears normal. Stomach/Bowel: Stomach appears within normal limits. The appendix is visualized and is normal. There is mild circumferential bowel wall edema involving the proximal colon. Marked edema is noted involving the mid and distal sigmoid colon and rectum. No pathologic dilatation of the large or small bowel loops. Vascular/Lymphatic: Aortic atherosclerosis. The upper abdominal vascularity is patent including the intra and extrahepatic portal vein. Small esophageal and gastric varices identified.  Recanalization of the umbilical vein. No abdominopelvic adenopathy identified. No abdominopelvic adenopathy. Reproductive: Prostate is unremarkable. Other: Small volume of perihepatic, lower abdominal and pelvic ascites. No discrete fluid collections. Musculoskeletal: No acute or significant osseous findings. IMPRESSION: 1. Morphologic features of the liver compatible with cirrhosis. There is a hepatic steatosis and marked hepatomegaly. Confluent fibrosis of the medial segment of left hepatic lobe noted. 2. Stigmata of portal venous hypertension with recanalized umbilical vein, small esophageal and gastric varices, and small volume of ascites. 3. Mild circumferential bowel wall edema involving the proximal colon with marked edema involving the mid and distal sigmoid colon and rectum. Findings are nonspecific and may reflect underlying colitis versus hepatic colopathy 4. Aortic Atherosclerosis (ICD10-I70.0). Electronically Signed   By: Kerby Moors M.D.   On: 06/11/2021 07:47   US Abdomen Limited  Result Date: 06/11/2021 CLINICAL DATA:  Right upper quadrant pain. EXAM: ULTRASOUND ABDOMEN LIMITED RIGHT UPPER QUADRANT COMPARISON:  None. FINDINGS: Gallbladder: No gallstones are visualized. The gallbladder wall is thickened and measures 4.8 mm in thickness. No sonographic Murphy sign noted by sonographer. Common bile duct: Diameter: 3.1 mm Liver: The liver is enlarged and measures approximately 23.2 cm in length. No focal lesion  identified. The liver parenchyma is nodular in contour and diffusely increased in echogenicity. No flow is identified within the portal vein on color Doppler evaluation. Other: A mild amount of ascites is seen adjacent to the liver. IMPRESSION: 1. Gallbladder wall thickening in the absence of cholelithiasis. 2. Hepatic steatosis and hepatomegaly with additional findings suggestive of hepatic cirrhosis. 3. No flow within the main portal vein which may represent sequelae associated with  extensive portal hypertension. Correlation with contrast-enhanced abdomen pelvis CT is recommended, as portal vein thrombosis cannot be excluded. 4. Perihepatic ascites which may represent the source of the previously noted gallbladder wall thickening. Electronically Signed   By: Virgina Norfolk M.D.   On: 06/11/2021 03:15    EKG: Independently reviewed.  Sinus tachycardia with rate 125; prolonged QTc 507; no evidence of acute ischemia   Labs on Admission: I have personally reviewed the available labs and imaging studies at the time of the admission.  Pertinent labs:    Na++ 126 K+ <2 CO2 33 Glucose 152 Anion gap 18 Mag++ 1.2 AP 398 Albumin 2.2 AST 196/ALT 74/Bili 24.5 Lactate 4.4 WBC 18.2 Hgb 11.5 Platelets 110 COVID/flu negative UA: moderate bili, 50 glucose, small Hgb, 30 protein UDS negative ETOH <10    Assessment and Plan: * Acute liver failure- (present on admission) -Patient with long-standing ETOH dependence and mild LFT elevation in 2020 (no f/u since) presenting with frank jaundice -His liver spans the entire R abdomen past the iliac crest on CT -He denies known h/o cirrhosis -Clearly appears to be in decompensated liver failure, likely from ETOH although possibly in conjunction with viral or autoimmune etiology -+thrombocytopenia c/w decompensated cirrhosis; INR is pending -EGD is recommended to screen for varices; if none present, screening should be done every 2-3 years -Once cirrhosis is decompensated, average survival is 1.6 years -MELD score of 15 is the threshold for a patient survival with transplantation > survival without transplantation; patients should be considered for transplant referral in this circumstance. -MELD/MELD-Na score is 30, with a 90-day mortality rate of 27-32% -Child-Pugh category is C, with a 45% 1-year survival -New-onset ascites requires diagnostic paracentesis; unfortunately he has scant ascites and in a location that is not  accessible -Patient needs to completely stop drinking alcohol; he will not be a transplant candidate for 6 months but could be referred to [facility] for evaluation as an outpatient sooner -Recommend 2 gram sodium restricted diet with nutritional education; consult requested -Will cover empirically with Rocephin 2 grams IV q24 hours -Will defer further evaluation and management to GI  Fever- (present on admission) -Patient had fever to 100.8 and code sepsis was called on presentation -He does have SIRS criteria with tachycardia, leukocytosis -Markedly elevated lactate, as well -His lower lungs were negative on abdominal CT, UA is normal, and he does not report complaints c/w other sources of infection -Will cover with Rocephin monotherapy for now and cancel code sepsis -Acute hepatitis panel is negative -Low-grade fever, tachycardia, and lactate are likely related to acute liver failure and/or ETOH withdrawal  Tobacco dependence- (present on admission) -Encourage cessation.   -This was discussed with the patient and should be reviewed on an ongoing basis.   -Patch ordered at patient request.  Alcohol dependence ([place])- (present on admission) -Patient with chronic ETOH dependence -Denies history of withdrawal including seizures, ICU admissions, DTs -Patient is exhibiting active s/sx of withdrawal as evidenced by his persistent tachycardia -He is at high risk for complications of withdrawal including seizures, DTs -Will admit  to progressive care at this time -CIWA protocol -Folate, thiamine, and MVI ordered -Will provide symptom-triggered BZD (ativan per CIWA protocol) only since the patient is able to communicate; is not showing current signs of delirium; and has no history of severe withdrawal. -TOC team consult for possible inpatient treatment/substance abuse education  Thrombocytopenia - Due to bone marrow suppression from alcohol abuse - Monitor daily platelet count   Hypokalemia-  (present on admission) -Undetectable level in the ER -Will replete with 40 mEq PO KCl and 6 runs of 10 mEq and then recheck this PM and tomorrow AM  -Will follow.   -Mag level is also low and this has also been repleted.  Prolonged QT interval- (present on admission) -Likely associated with marked electrolyte abnormalities and anticipate improvement once this is normalized -Will attempt to avoid QT-prolonging medications such as PPI, nausea meds, SSRIs -Repeat EKG in AM    Advance Care Planning:   Code Status: Full Code   Consults: Surgery; GI  DVT Prophylaxis: SCDs  Family Communication: None present at the time of my encounter but his mother was expected later today  Severity of Illness: The appropriate patient status for this patient is INPATIENT. Inpatient status is judged to be reasonable and necessary in order to provide the required intensity of service to ensure the patient's safety. The patient's presenting symptoms, physical exam findings, and initial radiographic and laboratory data in the context of their chronic comorbidities is felt to place them at high risk for further clinical deterioration. Furthermore, it is not anticipated that the patient will be medically stable for discharge from the hospital within 2 midnights of admission.   * I certify that at the point of admission it is my clinical judgment that the patient will require inpatient hospital care spanning beyond 2 midnights from the point of admission due to high intensity of service, high risk for further deterioration and high frequency of surveillance required.*  Author: Karmen Bongo, MD 06/11/2021 5:05 PM  For on call review [email or web address].

## 2021-06-11 NOTE — Consult Note (Signed)
Thomas Snyder April 01, 1981  [id number].    Requesting MD: Dr. Addison Lank Chief Complaint/Reason for Consult: hyperbilirubinemia, gb wall thickening  HPI:  This is a 41 yo male with a history of ETOH use/abuse who has had some vague issues for several years with hypokalemia, LE edema, etc that he says someone told him he may have "Addison's disease."  However this was prior to the pandemic and has since lost follow up.  This past Sunday for the [employer] he drank a lot, couldn't tell me how much he drank because it was so much.  He woke up feeling poorly and thinking he was just hung over.  He mostly had some back pain and minimal abdominal discomfort.  He denies N/V.  He has been eating some still, but less of an appetite.  He is passing flatus, but feels a bit constipated.  He denies any fevers.  2 days ago he noticed he was turning yellow.  His back pain resolved, but due to his yellowing he decided to come to the ED.  He does admit that after drinking, especially when he drinks a lot, his urine gets darker for a couple of days.    Upon presentation to the ED, he was noted to have a TB of 24 with some elevated LFTs.  He had an Korea that revealed some perihepatic ascites, slightly thickened gallbladder wall, and evidence of cirrhosis on his [place].  He then underwent a CT scan which revealed marked hepatomegaly and morphologic changes c/w cirrhosis as well as portal venous hypertension.  His gallbladder is decompressed and very small with no gallstones.  His electrolytes are markedly abnormal c/w liver failure.  His WBC is 18K.  We were asked to see patient to evaluate for cholecystitis.  ROS: ROS: Please see HPI, otherwise all other systems have been reviewed and are negative.  History reviewed. No pertinent family history.  History reviewed. No pertinent past medical history.  History reviewed. No pertinent surgical history.  Social History:  reports that he has been smoking cigarettes.  He has been smoking an average of .5 packs per day. He has never used smokeless tobacco. He reports current alcohol use of about 42.0 standard drinks per week. No history on file for drug use.  Allergies:  Allergies  Allergen Reactions   Milk-Related Compounds    Penicillins    Shellfish Allergy     Other reaction(s): Other (See Comments) Rash     (Not in a hospital admission)    Physical Exam: Blood pressure 128/80, pulse (!) 121, temperature 99.7 F (37.6 C), temperature source Oral, resp. rate 17, SpO2 95 %. General: pleasant, WD, WN white male who is laying in bed in NAD HEENT: head is normocephalic, atraumatic.  Sclera are icteric.   PERRL.  Ears and nose without any masses or lesions.  Mouth is pink and moist Heart: regular rhythm, but with tachycardia.  Normal s1,s2. No obvious murmurs, gallops, or rubs noted.  Palpable radial and pedal pulses bilaterally Lungs: CTAB, no wheezes, rhonchi, or rales noted.  Respiratory effort nonlabored Abd: soft, minimal right sided discomfort, but not really that tender, distention secondary to marked hepatomegaly, +BS, no masses, hernias. MS: all 4 extremities are symmetrical with no cyanosis, clubbing. BLE Edema noted Skin: warm and dry with no masses, lesions, or rashes.  Significant jaundice Neuro: Cranial nerves 2-12 grossly intact, sensation is normal throughout Psych: A&Ox3 with an appropriate affect.   Results for orders placed or  performed during the hospital encounter of 06/11/21 (from the past 48 hour(s))  Urinalysis, Routine w reflex microscopic Urine, Clean Catch     Status: Abnormal   Collection Time: 06/11/21  2:26 AM  Result Value Ref Range   Color, Urine AMBER (A) YELLOW    Comment: BIOCHEMICALS MAY BE AFFECTED BY COLOR   APPearance CLOUDY (A) CLEAR   Specific Gravity, Urine 1.021 1.005 - 1.030   pH 5.0 5.0 - 8.0   Glucose, UA 50 (A) NEGATIVE mg/dL   Hgb urine dipstick SMALL (A) NEGATIVE   Bilirubin Urine MODERATE (A)  NEGATIVE   Ketones, ur NEGATIVE NEGATIVE mg/dL   Protein, ur 30 (A) NEGATIVE mg/dL   Nitrite NEGATIVE NEGATIVE   Leukocytes,Ua NEGATIVE NEGATIVE   RBC / HPF 0-5 0 - 5 RBC/hpf   WBC, UA 6-10 0 - 5 WBC/hpf   Bacteria, UA FEW (A) NONE SEEN   Squamous Epithelial / LPF 0-5 0 - 5   Mucus PRESENT    Hyaline Casts, UA PRESENT    Cellular Cast, UA 40    Non Squamous Epithelial 6-10 (A) NONE SEEN    Comment: Performed at [facility], [address] [place], [place] [address]  Rapid urine drug screen (hospital performed)     Status: None   Collection Time: 06/11/21  2:27 AM  Result Value Ref Range   Opiates NONE DETECTED NONE DETECTED   Cocaine NONE DETECTED NONE DETECTED   Benzodiazepines NONE DETECTED NONE DETECTED   Amphetamines NONE DETECTED NONE DETECTED   Tetrahydrocannabinol NONE DETECTED NONE DETECTED   Barbiturates NONE DETECTED NONE DETECTED    Comment: (NOTE) DRUG SCREEN FOR MEDICAL PURPOSES ONLY.  IF CONFIRMATION IS NEEDED FOR ANY PURPOSE, NOTIFY LAB WITHIN 5 DAYS.  LOWEST DETECTABLE LIMITS FOR URINE DRUG SCREEN Drug Class                     Cutoff (ng/mL) Amphetamine and metabolites    1000 Barbiturate and metabolites    200 Benzodiazepine                 767 Tricyclics and metabolites     300 Opiates and metabolites        300 Cocaine and metabolites        300 THC                            50 Performed at [facility], [place] [address], [place], [place] [address]   CBC with Differential     Status: Abnormal   Collection Time: 06/11/21  2:49 AM  Result Value Ref Range   WBC 18.2 (H) 4.0 - 10.5 K/uL   RBC 3.27 (L) 4.22 - 5.81 MIL/uL   Hemoglobin 11.5 (L) 13.0 - 17.0 g/dL   HCT 31.5 (L) 39.0 - 52.0 %   MCV 96.3 80.0 - 100.0 fL   MCH 35.2 (H) 26.0 - 34.0 pg   MCHC 36.5 (H) 30.0 - 36.0 g/dL   RDW 14.9 11.5 - 15.5 %   Platelets 110 (L) 150 - 400 K/uL    Comment: Immature Platelet Fraction may be clinically indicated, consider ordering this  additional test [id number] REPEATED TO VERIFY PLATELET COUNT CONFIRMED BY SMEAR PLATELET CLUMPS NOTED ON SMEAR, COUNT APPEARS ADEQUATE    nRBC 0.2 0.0 - 0.2 %   Neutrophils Relative % 87 %   Neutro Abs 15.7 (H) 1.7 - 7.7  K/uL   Lymphocytes Relative 6 %   Lymphs Abs 1.1 0.7 - 4.0 K/uL   Monocytes Relative 6 %   Monocytes Absolute 1.2 (H) 0.1 - 1.0 K/uL   Eosinophils Relative 0 %   Eosinophils Absolute 0.0 0.0 - 0.5 K/uL   Basophils Relative 0 %   Basophils Absolute 0.0 0.0 - 0.1 K/uL   WBC Morphology MORPHOLOGY UNREMARKABLE    RBC Morphology MORPHOLOGY UNREMARKABLE    Smear Review PLATELET COUNT CONFIRMED BY SMEAR    Immature Granulocytes 1 %   Abs Immature Granulocytes 0.23 (H) 0.00 - 0.07 K/uL    Comment: Performed at [facility] [address], [place], [place] [address]  Comprehensive metabolic panel     Status: Abnormal   Collection Time: 06/11/21  2:49 AM  Result Value Ref Range   Sodium 126 (L) 135 - 145 mmol/L   Potassium <2.0 (LL) 3.5 - 5.1 mmol/L    Comment: CRITICAL RESULT CALLED TO, READ BACK BY AND VERIFIED WITH: Ginette Otto RN 06/11/21 0355 M KOROLESKI    Chloride 75 (L) 98 - 111 mmol/L   CO2 33 (H) 22 - 32 mmol/L   Glucose, Bld 152 (H) 70 - 99 mg/dL    Comment: Glucose reference range applies only to samples taken after fasting for at least 8 hours.   BUN <5 (L) 6 - 20 mg/dL   Creatinine, Ser 1.09 0.61 - 1.24 mg/dL   Calcium 8.8 (L) 8.9 - 10.3 mg/dL   Total Protein 6.8 6.5 - 8.1 g/dL   Albumin 2.2 (L) 3.5 - 5.0 g/dL   AST 196 (H) 15 - 41 U/L   ALT 74 (H) 0 - 44 U/L   Alkaline Phosphatase 398 (H) 38 - 126 U/L   Total Bilirubin 24.5 (HH) 0.3 - 1.2 mg/dL    Comment: CRITICAL RESULT CALLED TO, READ BACK BY AND VERIFIED WITH: KRISTINA MUNETT RN 06/11/21 0355 M KOROLESKI    GFR, Estimated >60 >60 mL/min    Comment: (NOTE) Calculated using the CKD-EPI Creatinine Equation (2021)    Anion gap 18 (H) 5 - 15    Comment: Performed at [facility], [place] [address], [place], [place] [address]  Lipase, blood     Status: None   Collection Time: 06/11/21  2:49 AM  Result Value Ref Range   Lipase 41 11 - 51 U/L    Comment: Performed at [facility] [address], [place], [place] [address]  Protime-INR     Status: Abnormal   Collection Time: 06/11/21  2:49 AM  Result Value Ref Range   Prothrombin Time 15.9 (H) 11.4 - 15.2 seconds   INR 1.3 (H) 0.8 - 1.2    Comment: (NOTE) INR goal varies based on device and disease states. Performed at [facility], [place] [address] [place], [place] [address]   Acetaminophen level     Status: Abnormal   Collection Time: 06/11/21  2:49 AM  Result Value Ref Range   Acetaminophen (Tylenol), Serum <10 (L) 10 - 30 ug/mL    Comment: (NOTE) Therapeutic concentrations vary significantly. A range of 10-30 ug/mL  may be an effective concentration for many patients. However, some  are best treated at concentrations outside of this range. Acetaminophen concentrations >150 ug/mL at 4 hours after ingestion  and >50 ug/mL at 12 hours after ingestion are often associated with  toxic reactions.  Performed at [facility], [place] [address]  [place], [place] [address]   Ethanol     Status: None   Collection Time: 06/11/21  2:49 AM  Result Value Ref Range   Alcohol, Ethyl (B) <10 <10 mg/dL    Comment: (NOTE) Lowest detectable limit for serum alcohol is 10 mg/dL.  For medical purposes only. Performed at [facility], [place] [address], [place], [place] [address]   Resp Panel by RT-PCR (Flu A&B, Covid) Nasopharyngeal Swab     Status: None   Collection Time: 06/11/21  3:15 AM   Specimen: Nasopharyngeal Swab; Nasopharyngeal(NP) swabs in vial transport medium  Result Value Ref Range   SARS Coronavirus 2 by RT PCR NEGATIVE NEGATIVE    Comment: (NOTE) SARS-CoV-2 target nucleic acids are NOT DETECTED.  The SARS-CoV-2 RNA is generally detectable in upper respiratory specimens during the  acute phase of infection. The lowest concentration of SARS-CoV-2 viral copies this assay can detect is 138 copies/mL. A negative result does not preclude SARS-Cov-2 infection and should not be used as the sole basis for treatment or other patient management decisions. A negative result may occur with  improper specimen collection/handling, submission of specimen other than nasopharyngeal swab, presence of viral mutation(s) within the areas targeted by this assay, and inadequate number of viral copies(<138 copies/mL). A negative result must be combined with clinical observations, patient history, and epidemiological information. The expected result is Negative.  Fact Sheet for Patients:  EntrepreneurPulse.com.au  Fact Sheet for Healthcare Providers:  IncredibleEmployment.be  This test is no t yet approved or cleared by the [place] FDA and  has been authorized for detection and/or diagnosis of SARS-CoV-2 by FDA under an Emergency Use Authorization (EUA). This EUA will remain  in effect (meaning this test can be used) for the duration of the COVID-19 declaration under Section 564(b)(1) of the Act, 21 U.S.C.section 360bbb-3(b)(1), unless the authorization is terminated  or revoked sooner.       Influenza A by PCR NEGATIVE NEGATIVE   Influenza B by PCR NEGATIVE NEGATIVE    Comment: (NOTE) The Xpert Xpress SARS-CoV-2/FLU/RSV plus assay is intended as an aid in the diagnosis of influenza from Nasopharyngeal swab specimens and should not be used as a sole basis for treatment. Nasal washings and aspirates are unacceptable for Xpert Xpress SARS-CoV-2/FLU/RSV testing.  Fact Sheet for Patients: EntrepreneurPulse.com.au  Fact Sheet for Healthcare Providers: IncredibleEmployment.be  This test is not yet approved or cleared by the [place] FDA and has been authorized for detection and/or diagnosis of  SARS-CoV-2 by FDA under an Emergency Use Authorization (EUA). This EUA will remain in effect (meaning this test can be used) for the duration of the COVID-19 declaration under Section 564(b)(1) of the Act, 21 U.S.C. section 360bbb-3(b)(1), unless the authorization is terminated or revoked.  Performed at [facility], [place] [address] [place], [place] [address]   Magnesium     Status: Abnormal   Collection Time: 06/11/21  4:54 AM  Result Value Ref Range   Magnesium 1.2 (L) 1.7 - 2.4 mg/dL    Comment: Performed at [facility] [address] [place], [place] [address]  Lactic acid, plasma     Status: Abnormal   Collection Time: 06/11/21  4:54 AM  Result Value Ref Range   Lactic Acid, Venous 4.4 (HH) 0.5 - 1.9 mmol/L    Comment: CRITICAL RESULT CALLED TO, READ BACK BY AND VERIFIED WITH: Ophelia Charter RN 06/11/21 0546 Wiliam Ke Performed at [facility], [address], [place], [place] [address]    CT ABDOMEN PELVIS W CONTRAST  Result Date: 06/11/2021 CLINICAL DATA:  Liver failure. EXAM: CT ABDOMEN AND PELVIS WITH CONTRAST TECHNIQUE: Multidetector CT imaging of the abdomen and pelvis was performed using the standard protocol following bolus administration of intravenous contrast. RADIATION DOSE REDUCTION: This exam was performed according to the departmental dose-optimization program which includes automated exposure control, adjustment of the mA and/or kV according to patient size and/or use of iterative reconstruction technique. CONTRAST:  46mL OMNIPAQUE IOHEXOL 350 MG/ML SOLN COMPARISON:  None. FINDINGS: Lower chest: No acute abnormality. Hepatobiliary: Hepatomegaly with diffuse hepatic steatosis. Atrophic medial segment of left hepatic lobe is favored to represent confluent fibrosis in the setting of cirrhosis. Recanalization of the umbilical vein is identified. Gallbladder appears partially decompressed. Pancreas: Unremarkable. No pancreatic ductal dilatation or  surrounding inflammatory changes. Spleen: Mild splenomegaly measuring 13.1 cm in length. No focal splenic lesion. Adrenals/Urinary Tract: Normal adrenal glands. The kidneys are unremarkable. No nephrolithiasis or hydronephrosis. Urinary bladder appears normal. Stomach/Bowel: Stomach appears within normal limits. The appendix is visualized and is normal. There is mild circumferential bowel wall edema involving the proximal colon. Marked edema is noted involving the mid and distal sigmoid colon and rectum. No pathologic dilatation of the large or small bowel loops. Vascular/Lymphatic: Aortic atherosclerosis. The upper abdominal vascularity is patent including the intra and extrahepatic portal vein. Small esophageal and gastric varices identified. Recanalization of the umbilical vein. No abdominopelvic adenopathy identified. No abdominopelvic adenopathy. Reproductive: Prostate is unremarkable. Other: Small volume of perihepatic, lower abdominal and pelvic ascites. No discrete fluid collections. Musculoskeletal: No acute or significant osseous findings. IMPRESSION: 1. Morphologic features of the liver compatible with cirrhosis. There is a hepatic steatosis and marked hepatomegaly. Confluent fibrosis of the medial segment of left hepatic lobe noted. 2. Stigmata of portal venous hypertension with recanalized umbilical vein, small esophageal and gastric varices, and small volume of ascites. 3. Mild circumferential bowel wall edema involving the proximal colon with marked edema involving the mid and distal sigmoid colon and rectum. Findings are nonspecific and may reflect underlying colitis versus hepatic colopathy 4. Aortic Atherosclerosis (ICD10-I70.0). Electronically Signed   By: Kerby Moors M.D.   On: 06/11/2021 07:47   US Abdomen Limited  Result Date: 06/11/2021 CLINICAL DATA:  Right upper quadrant pain. EXAM: ULTRASOUND ABDOMEN LIMITED RIGHT UPPER QUADRANT COMPARISON:  None. FINDINGS: Gallbladder: No gallstones  are visualized. The gallbladder wall is thickened and measures 4.8 mm in thickness. No sonographic Murphy sign noted by sonographer. Common bile duct: Diameter: 3.1 mm Liver: The liver is enlarged and measures approximately 23.2 cm in length. No focal lesion identified. The liver parenchyma is nodular in contour and diffusely increased in echogenicity. No flow is identified within the portal vein on color Doppler evaluation. Other: A mild amount of ascites is seen adjacent to the liver. IMPRESSION: 1. Gallbladder wall thickening in the absence of cholelithiasis. 2. Hepatic steatosis and hepatomegaly with additional findings suggestive of hepatic cirrhosis. 3. No flow within the main portal vein which may represent sequelae associated with extensive portal hypertension. Correlation with contrast-enhanced abdomen pelvis CT is recommended, as portal vein thrombosis cannot be excluded. 4. Perihepatic ascites which may represent the source of the previously noted gallbladder wall thickening. Electronically Signed   By: Virgina Norfolk M.D.   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  The patient does not clinically have cholecystitis.  His gallbladder is nondistended with no gallstones.  His wall thickening noted on his [place] is almost assuredly related to his cirrhosis.  The patient does not need any further gallbladder work up at this time.  He will need GI involvement to assist with cirrhotic disease process.  Given the extent of his cirrhosis and liver failure at this time, he would not be an operative candidate unless a dire emergency, but would still carry well over 50% risk of mortality.  Discussed this with the patient, but also reassured that we do not feel his gallbladder is the etiology of his current situation.  Will defer further care to the medical  service and GI service.  Discussed case with admitting team.   Moderate Medical Decision Making  Henreitta Cea, [facility] Surgery 06/11/2021, 9:06 AM Please see Amion for pager number during day hours 7:00am-4:30pm or 7:00am -11:30am on weekends

## 2021-06-11 NOTE — ED Provider Triage Note (Signed)
Emergency Medicine Provider Triage Evaluation Note  Thomas Snyder , a 41 y.o. male  was evaluated in triage.  Pt complains of N/V, right flank pain. States he drinks 1 beer and 5-6 shots per day, but consumed 3 times this amount throughout the day on Sunday. Awoke on Monday feeling "hungover" with pain across the back with nausea, vomiting x 2-3. Has continued to have pain to the right flank and right upper abdomen. Became more worried with progression of jaundice over the past 48 hours. No fevers, recent travel, abx use, prior abdominal surgeries.  Takes 650mg  Tylenol QD. Denies IVDU. Denies seizures from ETOH withdrawal or hx of tremors.  Review of Systems  Positive: As above Negative: As above  Physical Exam  BP (!) 151/97 (BP Location: Right Arm)    Pulse (!) 134    Temp 99.9 F (37.7 C) (Oral)    Resp 20    SpO2 94%  Gen:   Awake, no distress   Resp:  Normal effort  MSK:   Moves extremities without difficulty  Other:  Telangiectasias noted as well as jaundiced skin.  There is hepatomegaly palpated on exam.  Tenderness to palpation in the right upper quadrant. No large volume ascites, peritoneal signs.  Medical Decision Making  Medically screening exam initiated at 2:26 AM.  Appropriate orders placed.  Amour Cutrone was informed that the remainder of the evaluation will be completed by another provider, this initial triage assessment does not replace that evaluation, and the importance of remaining in the ED until their evaluation is complete.  N/V, abdominal pain - obvious jaundice with concern for liver failure. Plan for labs, RUQ ultrasound.   Antonietta Breach, PA-C 06/11/21 [phone]

## 2021-06-11 NOTE — Progress Notes (Signed)
Pt being followed by [facility] for Sepsis protocol. 

## 2021-06-11 NOTE — Assessment & Plan Note (Signed)
-  Patient had fever to 100.8 and code sepsis was called on presentation -He does have SIRS criteria with tachycardia, leukocytosis -Markedly elevated lactate, as well -His lower lungs were negative on abdominal CT, UA is normal, and he does not report complaints c/w other sources of infection -Will cover with Rocephin monotherapy for now and cancel code sepsis -Acute hepatitis panel is negative -Low-grade fever, tachycardia, and lactate are likely related to acute liver failure and/or ETOH withdrawal

## 2021-06-11 NOTE — Assessment & Plan Note (Addendum)
-  Patient with long-standing ETOH dependence and mild LFT elevation in 2020 (no f/u since) presenting with frank jaundice -His liver spans the entire R abdomen past the iliac crest on CT -He denies known h/o cirrhosis -Clearly appears to be in decompensated liver failure, likely from ETOH although possibly in conjunction with viral or autoimmune etiology -+thrombocytopenia c/w decompensated cirrhosis; INR is pending -EGD is recommended to screen for varices; if none present, screening should be done every 2-3 years -Once cirrhosis is decompensated, average survival is 1.6 years -MELD score of 15 is the threshold for a patient survival with transplantation > survival without transplantation; patients should be considered for transplant referral in this circumstance. -MELD/MELD-Na score is 30, with a 90-day mortality rate of 27-32% -Child-Pugh category is C, with a 45% 1-year survival -New-onset ascites requires diagnostic paracentesis; unfortunately he has scant ascites and in a location that is not accessible -Patient needs to completely stop drinking alcohol; he will not be a transplant candidate for 6 months but could be referred to [facility] for evaluation as an outpatient sooner -Recommend 2 gram sodium restricted diet with nutritional education; consult requested -Will cover empirically with Rocephin 2 grams IV q24 hours -Will defer further evaluation and management to GI

## 2021-06-11 NOTE — ED Provider Notes (Signed)
[facility] EMERGENCY DEPARTMENT Provider Note  CSN: [id number] Arrival date & time: 06/11/21 0206  Chief Complaint(s) Back Pain and Flank Pain  HPI Thomas Snyder is a 41 y.o. male with a history of alcoholism and daily alcohol intake of 1 beer and 4-5 shots.  He presents today for right upper quadrant and flank pain noted 4 to 5 days ago that gradually improved.  2 days ago he began noticing yellowing skin that continues to worsen prompting his visit today.  He endorsed heavy alcohol consumption on Super Bowl Sunday.  Reports that he was playing football and thought that the pain was related to a muscle strain or injury.  He denies any recent fevers or infections.  No coughing or congestion.  No chest pain or shortness of breath.  No abdominal pain.  No nausea or vomiting since Monday.  Patient reports abdomen distention is normal for him.  The history is provided by the patient.   Past Medical History History reviewed. No pertinent past medical history. There are no problems to display for this patient.  Home Medication(s) Prior to Admission medications   Medication Sig Start Date End Date Taking? Authorizing Provider  acetaminophen (TYLENOL) 325 MG tablet Take 650 mg by mouth every 6 (six) hours as needed for moderate pain or headache.   Yes [provider]  calcium carbonate (TUMS - DOSED IN MG ELEMENTAL CALCIUM) 500 MG chewable tablet Chew 1 tablet by mouth daily as needed for indigestion or heartburn.   Yes [provider]  loratadine (CLARITIN) 10 MG tablet Take 10 mg by mouth daily.   Yes [provider]  omeprazole (PRILOSEC OTC) 20 MG tablet Take 20 mg by mouth daily.   Yes [provider]                                                                                                                                    Allergies Milk-related compounds, Penicillins, and Shellfish allergy  Review of Systems Review of  Systems As noted in HPI  Physical Exam Vital Signs  I have reviewed the triage vital signs BP (!) 132/92 (BP Location: Left Arm)    Pulse (!) 125    Temp 99.7 F (37.6 C) (Oral)    Resp 18    SpO2 96%   Physical Exam Vitals reviewed.  Constitutional:      General: He is not in acute distress.    Appearance: He is well-developed. He is not diaphoretic.  HENT:     Head: Normocephalic and atraumatic.     Nose: Nose normal.  Eyes:     General: Scleral icterus present.        Right eye: No discharge.        Left eye: No discharge.     Conjunctiva/sclera: Conjunctivae normal.     Pupils: Pupils are equal, round, and reactive to light.  Cardiovascular:  Rate and Rhythm: Regular rhythm. Tachycardia present.     Heart sounds: No murmur heard.   No friction rub. No gallop.  Pulmonary:     Effort: Pulmonary effort is normal. No respiratory distress.     Breath sounds: Normal breath sounds. No stridor. No rales.  Abdominal:     General: There is distension.     Palpations: There is hepatomegaly.     Tenderness: There is abdominal tenderness in the right upper quadrant. There is no guarding or rebound. Negative signs include Murphy's sign.  Musculoskeletal:        General: No tenderness.     Cervical back: Normal range of motion and neck supple.  Skin:    General: Skin is warm and dry.     Coloration: Skin is jaundiced.     Findings: No erythema or rash.  Neurological:     Mental Status: He is alert and oriented to person, place, and time.    ED Results and Treatments Labs (all labs ordered are listed, but only abnormal results are displayed) Labs Reviewed  CBC WITH DIFFERENTIAL/PLATELET - Abnormal; Notable for the following components:      Result Value   WBC 18.2 (*)    RBC 3.27 (*)    Hemoglobin 11.5 (*)    HCT 31.5 (*)    MCH 35.2 (*)    MCHC 36.5 (*)    Platelets 110 (*)    Neutro Abs 15.7 (*)    Monocytes Absolute 1.2 (*)    Abs Immature Granulocytes 0.23 (*)     All other components within normal limits  COMPREHENSIVE METABOLIC PANEL - Abnormal; Notable for the following components:   Sodium 126 (*)    Potassium <2.0 (*)    Chloride 75 (*)    CO2 33 (*)    Glucose, Bld 152 (*)    BUN <5 (*)    Calcium 8.8 (*)    Albumin 2.2 (*)    AST 196 (*)    ALT 74 (*)    Alkaline Phosphatase 398 (*)    Total Bilirubin 24.5 (*)    Anion gap 18 (*)    All other components within normal limits  PROTIME-INR - Abnormal; Notable for the following components:   Prothrombin Time 15.9 (*)    INR 1.3 (*)    All other components within normal limits  URINALYSIS, ROUTINE W REFLEX MICROSCOPIC - Abnormal; Notable for the following components:   Color, Urine AMBER (*)    APPearance CLOUDY (*)    Glucose, UA 50 (*)    Hgb urine dipstick SMALL (*)    Bilirubin Urine MODERATE (*)    Protein, ur 30 (*)    Bacteria, UA FEW (*)    Non Squamous Epithelial 6-10 (*)    All other components within normal limits  ACETAMINOPHEN LEVEL - Abnormal; Notable for the following components:   Acetaminophen (Tylenol), Serum <10 (*)    All other components within normal limits  MAGNESIUM - Abnormal; Notable for the following components:   Magnesium 1.2 (*)    All other components within normal limits  LACTIC ACID, PLASMA - Abnormal; Notable for the following components:   Lactic Acid, Venous 4.4 (*)    All other components within normal limits  RESP PANEL BY RT-PCR (FLU A&B, COVID) ARPGX2  CULTURE, BLOOD (ROUTINE X 2)  CULTURE, BLOOD (ROUTINE X 2)  LIPASE, BLOOD  ETHANOL  RAPID URINE DRUG SCREEN, HOSP PERFORMED  HEPATITIS PANEL, ACUTE  LACTIC  ACID, PLASMA                                                                                                                         EKG  EKG Interpretation  Date/Time:  Saturday June 11 2021 04:58:49 EST Ventricular Rate:  125 PR Interval:  215 QRS Duration: 84 QT Interval:  351 QTC Calculation: 507 R Axis:   71 Text  Interpretation: Sinus tachycardia Prolonged PR interval LAE, consider biatrial enlargement Abnormal T, consider ischemia, diffuse leads Prolonged QT interval Confirmed by Addison Lank [phone]) on 06/11/2021 6:14:20 AM       Radiology CT ABDOMEN PELVIS W CONTRAST  Result Date: 06/11/2021 CLINICAL DATA:  Liver failure. EXAM: CT ABDOMEN AND PELVIS WITH CONTRAST TECHNIQUE: Multidetector CT imaging of the abdomen and pelvis was performed using the standard protocol following bolus administration of intravenous contrast. RADIATION DOSE REDUCTION: This exam was performed according to the departmental dose-optimization program which includes automated exposure control, adjustment of the mA and/or kV according to patient size and/or use of iterative reconstruction technique. CONTRAST:  36m OMNIPAQUE IOHEXOL 350 MG/ML SOLN COMPARISON:  None. FINDINGS: Lower chest: No acute abnormality. Hepatobiliary: Hepatomegaly with diffuse hepatic steatosis. Atrophic medial segment of left hepatic lobe is favored to represent confluent fibrosis in the setting of cirrhosis. Recanalization of the umbilical vein is identified. Gallbladder appears partially decompressed. Pancreas: Unremarkable. No pancreatic ductal dilatation or surrounding inflammatory changes. Spleen: Mild splenomegaly measuring 13.1 cm in length. No focal splenic lesion. Adrenals/Urinary Tract: Normal adrenal glands. The kidneys are unremarkable. No nephrolithiasis or hydronephrosis. Urinary bladder appears normal. Stomach/Bowel: Stomach appears within normal limits. The appendix is visualized and is normal. There is mild circumferential bowel wall edema involving the proximal colon. Marked edema is noted involving the mid and distal sigmoid colon and rectum. No pathologic dilatation of the large or small bowel loops. Vascular/Lymphatic: Aortic atherosclerosis. The upper abdominal vascularity is patent including the intra and extrahepatic portal vein. Small  esophageal and gastric varices identified. Recanalization of the umbilical vein. No abdominopelvic adenopathy identified. No abdominopelvic adenopathy. Reproductive: Prostate is unremarkable. Other: Small volume of perihepatic, lower abdominal and pelvic ascites. No discrete fluid collections. Musculoskeletal: No acute or significant osseous findings. IMPRESSION: 1. Morphologic features of the liver compatible with cirrhosis. There is a hepatic steatosis and marked hepatomegaly. Confluent fibrosis of the medial segment of left hepatic lobe noted. 2. Stigmata of portal venous hypertension with recanalized umbilical vein, small esophageal and gastric varices, and small volume of ascites. 3. Mild circumferential bowel wall edema involving the proximal colon with marked edema involving the mid and distal sigmoid colon and rectum. Findings are nonspecific and may reflect underlying colitis versus hepatic colopathy 4. Aortic Atherosclerosis (ICD10-I70.0). Electronically Signed   By: TKerby MoorsM.D.   On: 06/11/2021 07:47   [place]domen Limited  Result Date: 06/11/2021 CLINICAL DATA:  Right upper quadrant pain. EXAM: ULTRASOUND ABDOMEN LIMITED RIGHT UPPER QUADRANT COMPARISON:  None. FINDINGS: Gallbladder: No gallstones are  visualized. The gallbladder wall is thickened and measures 4.8 mm in thickness. No sonographic Murphy sign noted by sonographer. Common bile duct: Diameter: 3.1 mm Liver: The liver is enlarged and measures approximately 23.2 cm in length. No focal lesion identified. The liver parenchyma is nodular in contour and diffusely increased in echogenicity. No flow is identified within the portal vein on color Doppler evaluation. Other: A mild amount of ascites is seen adjacent to the liver. IMPRESSION: 1. Gallbladder wall thickening in the absence of cholelithiasis. 2. Hepatic steatosis and hepatomegaly with additional findings suggestive of hepatic cirrhosis. 3. No flow within the main portal vein which  may represent sequelae associated with extensive portal hypertension. Correlation with contrast-enhanced abdomen pelvis CT is recommended, as portal vein thrombosis cannot be excluded. 4. Perihepatic ascites which may represent the source of the previously noted gallbladder wall thickening. Electronically Signed   By: Virgina Norfolk M.D.   On: 06/11/2021 03:15    Pertinent labs & imaging results that were available during my care of the patient were reviewed by me and considered in my medical decision making (see MDM for details).  Medications Ordered in ED Medications  metroNIDAZOLE (FLAGYL) IVPB 500 mg (0 mg Intravenous Stopped 06/11/21 0630)  0.9 %  sodium chloride infusion ( Intravenous New Bag/Given 06/11/21 0726)  ciprofloxacin (CIPRO) IVPB 400 mg (400 mg Intravenous New Bag/Given 06/11/21 6160)  sodium chloride 0.9 % bolus 1,000 mL (0 mLs Intravenous Stopped 06/11/21 0723)  iohexol (OMNIPAQUE) 350 MG/ML injection 80 mL (80 mLs Intravenous Contrast Given 06/11/21 0719)                                                                                                                                     Procedures .1-3 Lead EKG Interpretation Performed by: Fatima Blank, MD Authorized by: Fatima Blank, MD     Interpretation: abnormal     ECG rate:  144   ECG rate assessment: tachycardic     Rhythm: sinus rhythm     Ectopy: none     Conduction: normal   .Critical Care Performed by: Fatima Blank, MD Authorized by: Fatima Blank, MD   Critical care provider statement:    Critical care time (minutes):  75   Critical care time was exclusive of:  Separately billable procedures and treating other patients   Critical care was necessary to treat or prevent imminent or life-threatening deterioration of the following conditions:  Hepatic failure   Critical care was time spent personally by me on the following activities:  Development of treatment plan with  patient or surrogate, discussions with consultants, evaluation of patient's response to treatment, examination of patient, obtaining history from patient or surrogate, review of old charts, re-evaluation of patient's condition, pulse oximetry, ordering and review of radiographic studies, ordering and review of laboratory studies and ordering and performing treatments and interventions   Care discussed with: admitting provider    (  including critical care time)  Medical Decision Making / ED Course        Right flank pain We will need to assess for acute hepatitis, cholecystitis, cholangitis, pancreatitis.   Work-up ordered to assess concerns above.  Labs and imaging independently interpreted by me and noted below: CBC notable for significant leukocytosis and mild anemia.  Mild thrombocytopenia. CMP with hyponatremia hypokalemia, mild hyperglycemia, evidence consistent with hepatitis with alk phos of 398 and bilirubin of 24.5. Lactic acid of 4.4 Right upper quadrant notable for gallbladder wall thickening without stones.  No common bile duct dilatation.  Radiology confirm and also noted there was no flow through the portal vein concerning for possible portal vein thrombosis.  CT ordered. CT without portal vein thrombosis.  There is mild perihepatic ascites, hepatomegaly consistent with hepatitis.   Management: Patient started on IV fluids. Code sepsis was initiated and patient was started on empiric and biotics initially for possible acalculous cholecystitis.  Then the gallbladder wall thickening may be reactive and related to hepatitis with MDF >32 Consulted with general surgery and spoke with Dr. Rosendo Gros.  He stated that he will evaluate the patient but surgery will not likely be performed. Consulted with [facility] who will evaluate the patient during admission. Consulted with ICU who felt patient was stable enough to be seen and admitted by medicine. Spoke with Dr. Lorin Mercy from the hospital  service who will admit the patient for further management.   Reassessment: Hemodynamically stable Tachycardia improving. Final Clinical Impression(s) / ED Diagnoses Final diagnoses:  Acute liver failure without hepatic coma           This chart was dictated using voice recognition software.  Despite best efforts to proofread,  errors can occur which can change the documentation meaning.    Fatima Blank, MD 06/11/21 [phone]

## 2021-06-11 NOTE — Assessment & Plan Note (Signed)
-  Undetectable level in the ER -Will replete with 40 mEq PO KCl and 6 runs of 10 mEq and then recheck this PM and tomorrow AM  -Will follow.   -Mag level is also low and this has also been repleted.

## 2021-06-11 NOTE — ED Notes (Signed)
Pt's mother at bedside-- pt is alert/oriented x 4, denies any pain, jaundice skin and icteric sclera- IV infusing without difficulty in right AC--  1+ posterior ankle edema

## 2021-06-11 NOTE — Sepsis Progress Note (Signed)
Notified provider of need to order additional  fluid bolus. 

## 2021-06-12 ENCOUNTER — Inpatient Hospital Stay (HOSPITAL_COMMUNITY): Payer: 59

## 2021-06-12 DIAGNOSIS — K7031 Alcoholic cirrhosis of liver with ascites: Secondary | ICD-10-CM

## 2021-06-12 LAB — OSMOLALITY, URINE: Osmolality, Ur: 508 mOsm/kg (ref 300–900)

## 2021-06-12 LAB — CBC WITH DIFFERENTIAL/PLATELET
Abs Immature Granulocytes: 0.18 10*3/uL — ABNORMAL HIGH (ref 0.00–0.07)
Basophils Absolute: 0 10*3/uL (ref 0.0–0.1)
Basophils Relative: 0 %
Eosinophils Absolute: 0.1 10*3/uL (ref 0.0–0.5)
Eosinophils Relative: 1 %
HCT: 27.9 % — ABNORMAL LOW (ref 39.0–52.0)
Hemoglobin: 10 g/dL — ABNORMAL LOW (ref 13.0–17.0)
Immature Granulocytes: 1 %
Lymphocytes Relative: 10 %
Lymphs Abs: 1.3 10*3/uL (ref 0.7–4.0)
MCH: 34.7 pg — ABNORMAL HIGH (ref 26.0–34.0)
MCHC: 35.8 g/dL (ref 30.0–36.0)
MCV: 96.9 fL (ref 80.0–100.0)
Monocytes Absolute: 0.9 10*3/uL (ref 0.1–1.0)
Monocytes Relative: 7 %
Neutro Abs: 10.4 10*3/uL — ABNORMAL HIGH (ref 1.7–7.7)
Neutrophils Relative %: 81 %
Platelets: 88 10*3/uL — ABNORMAL LOW (ref 150–400)
RBC: 2.88 MIL/uL — ABNORMAL LOW (ref 4.22–5.81)
RDW: 15.5 % (ref 11.5–15.5)
WBC: 12.9 10*3/uL — ABNORMAL HIGH (ref 4.0–10.5)
nRBC: 0.2 % (ref 0.0–0.2)

## 2021-06-12 LAB — PROTIME-INR
INR: 1.4 — ABNORMAL HIGH (ref 0.8–1.2)
Prothrombin Time: 16.7 seconds — ABNORMAL HIGH (ref 11.4–15.2)

## 2021-06-12 LAB — RETICULOCYTES
Immature Retic Fract: 22.8 % — ABNORMAL HIGH (ref 2.3–15.9)
RBC.: 2.87 MIL/uL — ABNORMAL LOW (ref 4.22–5.81)
Retic Count, Absolute: 108.5 10*3/uL (ref 19.0–186.0)
Retic Ct Pct: 3.8 % — ABNORMAL HIGH (ref 0.4–3.1)

## 2021-06-12 LAB — CREATININE, URINE, RANDOM: Creatinine, Urine: 148.52 mg/dL

## 2021-06-12 LAB — IRON AND TIBC
Iron: 137 ug/dL (ref 45–182)
Saturation Ratios: 104 % — ABNORMAL HIGH (ref 17.9–39.5)
TIBC: 132 ug/dL — ABNORMAL LOW (ref 250–450)

## 2021-06-12 LAB — COMPREHENSIVE METABOLIC PANEL
ALT: 56 U/L — ABNORMAL HIGH (ref 0–44)
AST: 163 U/L — ABNORMAL HIGH (ref 15–41)
Albumin: 1.8 g/dL — ABNORMAL LOW (ref 3.5–5.0)
Alkaline Phosphatase: 316 U/L — ABNORMAL HIGH (ref 38–126)
Anion gap: 12 (ref 5–15)
BUN: 6 mg/dL (ref 6–20)
CO2: 34 mmol/L — ABNORMAL HIGH (ref 22–32)
Calcium: 7.6 mg/dL — ABNORMAL LOW (ref 8.9–10.3)
Chloride: 81 mmol/L — ABNORMAL LOW (ref 98–111)
Creatinine, Ser: 0.72 mg/dL (ref 0.61–1.24)
GFR, Estimated: >60 mL/min (ref >60)
Glucose, Bld: 116 mg/dL — ABNORMAL HIGH (ref 70–99)
Potassium: 2.2 mmol/L — CL (ref 3.5–5.1)
Sodium: 127 mmol/L — ABNORMAL LOW (ref 135–145)
Total Bilirubin: 23.7 mg/dL (ref 0.3–1.2)
Total Protein: 5.8 g/dL — ABNORMAL LOW (ref 6.5–8.1)

## 2021-06-12 LAB — ABO/RH: ABO/RH(D): A NEG

## 2021-06-12 LAB — C-REACTIVE PROTEIN: CRP: 23.3 mg/dL — ABNORMAL HIGH (ref <1.0)

## 2021-06-12 LAB — OSMOLALITY: Osmolality: 263 mOsm/kg — ABNORMAL LOW (ref 275–295)

## 2021-06-12 LAB — VITAMIN B12: Vitamin B-12: 1475 pg/mL — ABNORMAL HIGH (ref 180–914)

## 2021-06-12 LAB — SODIUM, URINE, RANDOM: Sodium, Ur: <10 mmol/L

## 2021-06-12 LAB — MAGNESIUM: Magnesium: 1.7 mg/dL (ref 1.7–2.4)

## 2021-06-12 LAB — PROCALCITONIN: Procalcitonin: 0.6 ng/mL

## 2021-06-12 LAB — TRANSFERRIN: Transferrin: 94 mg/dL — ABNORMAL LOW (ref 180–329)

## 2021-06-12 LAB — URIC ACID: Uric Acid, Serum: 2.2 mg/dL — ABNORMAL LOW (ref 3.7–8.6)

## 2021-06-12 LAB — POTASSIUM
Potassium: 3 mmol/L — ABNORMAL LOW (ref 3.5–5.1)
Potassium: 3.9 mmol/L (ref 3.5–5.1)

## 2021-06-12 LAB — HEPATITIS A ANTIBODY, TOTAL: hep A Total Ab: NONREACTIVE

## 2021-06-12 LAB — FOLATE: Folate: 2.9 ng/mL — ABNORMAL LOW (ref >5.9)

## 2021-06-12 LAB — FERRITIN: Ferritin: 807 ng/mL — ABNORMAL HIGH (ref 24–336)

## 2021-06-12 LAB — GLUCOSE, CAPILLARY: Glucose-Capillary: 192 mg/dL — ABNORMAL HIGH (ref 70–99)

## 2021-06-12 MED ORDER — ALBUMIN HUMAN 25 % IV SOLN: 50.0000 g | Freq: Once | INTRAVENOUS | Status: DC | PRN

## 2021-06-12 MED ORDER — POTASSIUM CHLORIDE CRYS ER 20 MEQ PO TBCR
40.0000 meq | EXTENDED_RELEASE_TABLET | Freq: Once | ORAL | Status: AC
  Administered 2021-06-12: 40 meq via ORAL
  Filled 2021-06-12: qty 2

## 2021-06-12 MED ORDER — MAGNESIUM SULFATE IN D5W 1-5 GM/100ML-% IV SOLN
1.0000 g | Freq: Once | INTRAVENOUS | Status: AC
  Administered 2021-06-12: 1 g via INTRAVENOUS
  Filled 2021-06-12: qty 100

## 2021-06-12 MED ORDER — PANTOPRAZOLE SODIUM 40 MG PO TBEC
40.0000 mg | DELAYED_RELEASE_TABLET | Freq: Every day | ORAL | Status: DC
  Administered 2021-06-12 – 2021-06-15 (×4): 40 mg via ORAL
  Filled 2021-06-12 (×4): qty 1

## 2021-06-12 MED ORDER — POTASSIUM CHLORIDE 10 MEQ/100ML IV SOLN
10.0000 meq | INTRAVENOUS | Status: AC
  Administered 2021-06-12 (×6): 10 meq via INTRAVENOUS
  Filled 2021-06-12 (×6): qty 100

## 2021-06-12 MED ORDER — METRONIDAZOLE 500 MG PO TABS
500.0000 mg | ORAL_TABLET | Freq: Three times a day (TID) | ORAL | Status: DC
  Administered 2021-06-12 – 2021-06-15 (×9): 500 mg via ORAL
  Filled 2021-06-12 (×9): qty 1

## 2021-06-12 MED ORDER — PREDNISOLONE 5 MG PO TABS
40.0000 mg | ORAL_TABLET | Freq: Every day | ORAL | Status: DC
Start: 2021-06-12 — End: 2021-06-15
  Administered 2021-06-12 – 2021-06-15 (×4): 40 mg via ORAL
  Filled 2021-06-12 (×4): qty 8

## 2021-06-12 MED ORDER — POTASSIUM CHLORIDE 10 MEQ/100ML IV SOLN
10.0000 meq | INTRAVENOUS | Status: AC
  Administered 2021-06-12 (×3): 10 meq via INTRAVENOUS
  Filled 2021-06-12 (×3): qty 100

## 2021-06-12 NOTE — Progress Notes (Signed)
OT Cancellation Note  Patient Details Name: Hillman Attig MRN: [id number] DOB: 21-Nov-1980   Cancelled Treatment:    Reason Eval/Treat Not Completed: OT screened, no needs identified, will sign off. Pt up with PT, requiring no assist. About to independently shower with RN approval. Pt has no OT needs at this time and acute OT will sign off.   Damonie Ellenwood Elane Akeiba Axelson 06/12/2021, 1:37 PM

## 2021-06-12 NOTE — Progress Notes (Signed)
Pt was brought to [place] for paracentesis. Upon US examination, there was no fluid on L and a very small fluid pocket on R that was not large enough to safely access.  Exam was ended and explained to patient.  Patient was in agreement with findings.      Narda Rutherford, AGNP-BC 06/12/2021, 12:04 PM

## 2021-06-12 NOTE — Progress Notes (Signed)
Daily Rounding Note  06/12/2021, 9:50 AM  LOS: 1 day   SUBJECTIVE:   Chief complaint: Decompensated cirrhosis.  Alcoholic hepatitis.  Alcohol use disorder.  Last elevated temperature of 100.3 was at 1230 yesterday afternoon.  Continues tachycardic.  Blood pressure readings tending hypertensive. Started having low abdominal/pelvic area discomfort with coughing overnight.  Belly is a bit more distended.  With consumption of solids, he is unable to eat much volume due to sense that there is no no room for the food in his stomach.  No nausea. Stool brown. Urine brown, output for part of day yest was 950 mL but also had unmeasured outputs.  Patient says he is peeing a normal amount.  OBJECTIVE:         Vital signs in last 24 hours:    Temp:  [97.9 F (36.6 C)-100.8 F (38.2 C)] 98.3 F (36.8 C) (02/19 0900) Pulse Rate:  [117-122] 118 (02/19 0900) Resp:  [11-21] 18 (02/19 0900) BP: (117-136)/(74-94) 127/94 (02/19 0900) SpO2:  [88 %-97 %] 95 % (02/18 1700) Weight:  [77.1 kg] 77.1 kg (02/18 1031) Last BM Date : 06/11/21 Filed Weights   06/11/21 1031  Weight: 77.1 kg   General: Jaundiced, otherwise looks well. Heart: Tachycardic, regular. Chest: No labored breathing or cough.  Lungs clear. Abdomen: A bit more tense and protuberant than yesterday.  Not tender.  Bowel sounds present. GU: urine in urinal is dark brown.   Extremities: No CCE. Neuro/Psych: Oriented x3.  No asterixis or tremors.  Appropriate.  Fluid speech.  Pleasant affect.  Intake/Output from previous day: 02/18 0701 - 02/19 0700 In: 3691.1 [I.V.:2103.9; IV Piggyback:1587.2] Out: 950 [Urine:950]  Intake/Output this shift: No intake/output data recorded.  Lab Results: Recent Labs    06/11/21 0249 06/12/21 0118  WBC 18.2* 12.9*  HGB 11.5* 10.0*  HCT 31.5* 27.9*  PLT 110* 88*   BMET Recent Labs    06/11/21 0249 06/11/21 1738 06/12/21 0118  NA  126* 127* 127*  K <2.0* 2.4* 2.2*  CL 75* 80* 81*  CO2 33* 34* 34*  GLUCOSE 152* 120* 116*  BUN <5* 5* 6  CREATININE 1.09 0.71 0.72  CALCIUM 8.8* 7.7* 7.6*   LFT Recent Labs    06/11/21 0249 06/12/21 0118  PROT 6.8 5.8*  ALBUMIN 2.2* 1.8*  AST 196* 163*  ALT 74* 56*  ALKPHOS 398* 316*  BILITOT 24.5* 23.7*   PT/INR Recent Labs    06/11/21 1738 06/12/21 0118  LABPROT 16.0* 16.7*  INR 1.3* 1.4*   Hepatitis Panel Recent Labs    06/11/21 0249  HEPBSAG NON REACTIVE  HCVAB NON REACTIVE  HEPAIGM NON REACTIVE  HEPBIGM NON REACTIVE    Studies/Results: CT ABDOMEN PELVIS W CONTRAST  Result Date: 06/11/2021 CLINICAL DATA:  Liver failure. EXAM: CT ABDOMEN AND PELVIS WITH CONTRAST TECHNIQUE: Multidetector CT imaging of the abdomen and pelvis was performed using the standard protocol following bolus administration of intravenous contrast. RADIATION DOSE REDUCTION: This exam was performed according to the departmental dose-optimization program which includes automated exposure control, adjustment of the mA and/or kV according to patient size and/or use of iterative reconstruction technique. CONTRAST:  22mL OMNIPAQUE IOHEXOL 350 MG/ML SOLN COMPARISON:  None. FINDINGS: Lower chest: No acute abnormality. Hepatobiliary: Hepatomegaly with diffuse hepatic steatosis. Atrophic medial segment of left hepatic lobe is favored to represent confluent fibrosis in the setting of cirrhosis. Recanalization of the umbilical vein is identified. Gallbladder appears partially decompressed. Pancreas:  Unremarkable. No pancreatic ductal dilatation or surrounding inflammatory changes. Spleen: Mild splenomegaly measuring 13.1 cm in length. No focal splenic lesion. Adrenals/Urinary Tract: Normal adrenal glands. The kidneys are unremarkable. No nephrolithiasis or hydronephrosis. Urinary bladder appears normal. Stomach/Bowel: Stomach appears within normal limits. The appendix is visualized and is normal. There is mild  circumferential bowel wall edema involving the proximal colon. Marked edema is noted involving the mid and distal sigmoid colon and rectum. No pathologic dilatation of the large or small bowel loops. Vascular/Lymphatic: Aortic atherosclerosis. The upper abdominal vascularity is patent including the intra and extrahepatic portal vein. Small esophageal and gastric varices identified. Recanalization of the umbilical vein. No abdominopelvic adenopathy identified. No abdominopelvic adenopathy. Reproductive: Prostate is unremarkable. Other: Small volume of perihepatic, lower abdominal and pelvic ascites. No discrete fluid collections. Musculoskeletal: No acute or significant osseous findings. IMPRESSION: 1. Morphologic features of the liver compatible with cirrhosis. There is a hepatic steatosis and marked hepatomegaly. Confluent fibrosis of the medial segment of left hepatic lobe noted. 2. Stigmata of portal venous hypertension with recanalized umbilical vein, small esophageal and gastric varices, and small volume of ascites. 3. Mild circumferential bowel wall edema involving the proximal colon with marked edema involving the mid and distal sigmoid colon and rectum. Findings are nonspecific and may reflect underlying colitis versus hepatic colopathy 4. Aortic Atherosclerosis (ICD10-I70.0). Electronically Signed   By: Kerby Moors M.D.   On: 06/11/2021 07:47   US Abdomen Limited  Result Date: 06/11/2021 CLINICAL DATA:  Right upper quadrant pain. EXAM: ULTRASOUND ABDOMEN LIMITED RIGHT UPPER QUADRANT COMPARISON:  None. FINDINGS: Gallbladder: No gallstones are visualized. The gallbladder wall is thickened and measures 4.8 mm in thickness. No sonographic Murphy sign noted by sonographer. Common bile duct: Diameter: 3.1 mm Liver: The liver is enlarged and measures approximately 23.2 cm in length. No focal lesion identified. The liver parenchyma is nodular in contour and diffusely increased in echogenicity. No flow is  identified within the portal vein on color Doppler evaluation. Other: A mild amount of ascites is seen adjacent to the liver. IMPRESSION: 1. Gallbladder wall thickening in the absence of cholelithiasis. 2. Hepatic steatosis and hepatomegaly with additional findings suggestive of hepatic cirrhosis. 3. No flow within the main portal vein which may represent sequelae associated with extensive portal hypertension. Correlation with contrast-enhanced abdomen pelvis CT is recommended, as portal vein thrombosis cannot be excluded. 4. Perihepatic ascites which may represent the source of the previously noted gallbladder wall thickening. Electronically Signed   By: Virgina Norfolk M.D.   On: 06/11/2021 03:15    ASSESMENT:   Jaundice.  New diagnosis of decompensated cirrhosis.  Acute alcoholic hepatitis.  Discriminant function score 35.  Hepatitis B Surf Ab, Hep B core Igm, HCV Ab, Hep A IgM are all negative.  Pending labs include ANA, AMA, mitochondrial Ab, ceruloplasmin, alpha 1 AT, AFP.    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  Gallbladder wall thickening and presence of perihepatic ascites.  Small volume ascites.  IR confirmed yesterday that this is too small to tap.   Thickening, edema in proximal colon, sigmoid and rectum.  No sxs of colitis.  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  Hypokalemia.  Persists w K of 2.2despite 6 runs of IV K and 40 meq po administered 2/18, yesterday.  No abnormal BUN/Creat.      Hypomagnesemia, corrected.    DM 2.  Not previously treated.  Leukocytosis. 18.2 .. 12.9.  empiric Rocephin and po  Flagyl.    Coagulopathy.  Got PO Vit K yesterday 2/18  Thrombocytopenia.  Platelets 110 .Marland Kitchen  88.  Hyponatremia.  Na 126 .Marland Kitchen 127.    Macrocytic anemia.  Folate deficiency.  Iron, iron sats, ferritin at or above normal.  Low TIBC.  P16 normal.  Folic acid po now added.     Lactic acidosis.  Improved 4.4 .. 2.2.  empiric Rocephin  and po Flagyl in place.  CRP also elevated at 23.    ETOH use disorder.   PLAN   Currently headed downstairs for attempted paracentesis.  Additional potassium supplement added by hospitalist.  Leave current abx in place.  EGD to assess varices within next few days.      Azucena Freed  06/12/2021, 9:50 AM Phone [phone]

## 2021-06-12 NOTE — Evaluation (Signed)
Physical Therapy Evaluation and Discharge Patient Details Name: Thomas Snyder MRN: [id number] DOB: 1980-11-29 Today's Date: 06/12/2021  History of Present Illness  41 y.o. male with medical history significant of alcohol dependence presenting with back and flank pain, jaundice. +acute liver failure; CIWA protocol  Clinical Impression   Patient evaluated by Physical Therapy with no further acute PT needs identified. Patient ambulated 300 ft independently. PT is signing off. Thank you for this referral.        Recommendations for follow up therapy are one component of a multi-disciplinary discharge planning process, led by the attending physician.  Recommendations may be updated based on patient status, additional functional criteria and insurance authorization.  Follow Up Recommendations No PT follow up    Assistance Recommended at Discharge None  Patient can return home with the following       Equipment Recommendations None recommended by PT  Recommendations for Other Services       Functional Status Assessment Patient has not had a recent decline in their functional status     Precautions / Restrictions Precautions Precautions: None      Mobility  Bed Mobility Overal bed mobility: Independent                  Transfers Overall transfer level: Independent                      Ambulation/Gait Ambulation/Gait assistance: Independent Gait Distance (Feet): 300 Feet Assistive device: None Gait Pattern/deviations: WFL(Within Functional Limits)   Gait velocity interpretation: >2.62 ft/sec, indicative of community ambulatory      Stairs            Wheelchair Mobility    Modified Rankin (Stroke Patients Only)       Balance Overall balance assessment: Independent                                           Pertinent Vitals/Pain Pain Assessment Pain Assessment: Faces Faces Pain Scale: Hurts a little bit Pain  Location: lower stomach after eating Pain Descriptors / Indicators: Discomfort Pain Intervention(s): Monitored during session    Home Living Family/patient expects to be discharged to:: Private residence                        Prior Function Prior Level of Function : Independent/Modified Independent                     Hand Dominance        Extremity/Trunk Assessment   Upper Extremity Assessment Upper Extremity Assessment: Overall WFL for tasks assessed    Lower Extremity Assessment Lower Extremity Assessment: Overall WFL for tasks assessed    Cervical / Trunk Assessment Cervical / Trunk Assessment: Other exceptions Cervical / Trunk Exceptions: distended abdomen  Communication   Communication: No difficulties  Cognition Arousal/Alertness: Awake/alert Behavior During Therapy: WFL for tasks assessed/performed Overall Cognitive Status: Within Functional Limits for tasks assessed                                          General Comments General comments (skin integrity, edema, etc.): pt able to bend over to start his underwear and his pants despite stomach distention    Exercises  Assessment/Plan    PT Assessment Patient does not need any further PT services  PT Problem List         PT Treatment Interventions      PT Goals (Current goals can be found in the Care Plan section)  Acute Rehab PT Goals PT Goal Formulation: All assessment and education complete, DC therapy    Frequency       Co-evaluation               AM-PAC PT "6 Clicks" Mobility  Outcome Measure Help needed turning from your back to your side while in a flat bed without using bedrails?: None Help needed moving from lying on your back to sitting on the side of a flat bed without using bedrails?: None Help needed moving to and from a bed to a chair (including a wheelchair)?: None Help needed standing up from a chair using your arms (e.g., wheelchair  or bedside chair)?: None Help needed to walk in hospital room?: None Help needed climbing 3-5 steps with a railing? : None 6 Click Score: 24    End of Session   Activity Tolerance: Patient tolerated treatment well Patient left: in bed;with nursing/sitter in room Nurse Communication: Mobility status PT Visit Diagnosis: Difficulty in walking, not elsewhere classified (R26.2)    Time: 1173-5670 PT Time Calculation (min) (ACUTE ONLY): 13 min   Charges:   PT Evaluation $PT Eval Low Complexity: [place], PT Acute Rehabilitation Services  Pager [phone] Office [phone]   Rexanne Mano 06/12/2021, 12:39 PM

## 2021-06-12 NOTE — Progress Notes (Addendum)
PROGRESS NOTE                                                                                                                                                                                                             Patient Demographics:    Thomas Snyder, is a 41 y.o. male, DOB - 1980/10/11, PNT:[id number]  Outpatient Primary MD for the patient is Pcp, No    LOS - 1  Admit date - 06/11/2021    Chief Complaint  Patient presents with   Back Pain   Flank Pain       Brief Narrative (HPI from H&P)    Thomas Snyder is a 41 y.o. male with medical history significant of alcohol dependence presenting with back and flank pain, jaundice.  He reports that he drinks daily, usually 1 beer and about 4-5 shots of bourbon (it takes him about a week to drink a bottle).  He binged a lot of alcohol on the day of [employer] but his last alcoholic drink was on 0/86/7619.  In the ER he was diagnosed with suspected acute alcoholic hepatitis, GI was consulted and he was admitted for further care.   Subjective:    Staci Acosta today has, No headache, No chest pain, No abdominal pain - No Nausea, No new weakness tingling or numbness, no SOB.   Assessment  & Plan :    Acute alcoholic hepatitis with underlying cirrhosis and ongoing alcohol abuse.  He also has evidence of portal hypertension with esophageal varices on CT scan along with ascites, he has been seen by GI currently placed on IV Rocephin for SBP prophylaxis, his discriminant factor is 40 so we will start him on prednisolone, exam shows massive abdominal distention although imaging shows mild to moderate ascites we will try and get ultrasound paracentesis for both therapeutic and diagnostic purposes with IV albumin on board.  Strictly counseled to quit alcohol last drink over a week ago according to the patient.  His iron studies noted will add transferrin level as well and defer to  GI.   Alcohol and tobacco abuse.  Last drink over a week before admission.  Counseled to quit both, on CIWA protocol will monitor.  Alcoholic cirrhosis with underlying thrombocytopenia.  Supportive care.  Low-grade fever and leukocytosis upon admission.  No clear source could be SBP, improving  on Rocephin, paracentesis as above if enough fluid, monitor cultures trend procalcitonin and CRP.  Continue Rocephin.  Hyponatremia and hypokalemia.  Replace potassium, check serum osmolality, urine osmolality, urine sodium and serum uric acid level.  Likely due to fluid overload from cirrhosis but will monitor.  Prolonged QTc.  Replace electrolytes and monitor.  Recheck once potassium is stable.  Nonspecific gallbladder thickening.  General surgery following will monitor.  Could be third spaced fluid.  On Rocephin continue will add few days of oral Flagyl as well.       Condition - Extremely Guarded  Family Communication  :  None present  Code Status :  Full  Consults  :  GI  PUD Prophylaxis : PPI   Procedures  :     CT - 1. Morphologic features of the liver compatible with cirrhosis. There is a hepatic steatosis and marked hepatomegaly. Confluent fibrosis of the medial segment of left hepatic lobe noted. 2. Stigmata of portal venous hypertension with recanalized umbilical vein, small esophageal and gastric varices, and small volume of ascites. 3. Mild circumferential bowel wall edema involving the proximal colon with marked edema involving the mid and distal sigmoid colon and rectum. Findings are nonspecific and may reflect underlying colitis versus hepatic colopathy 4. Aortic Atherosclerosis   [place] - 1. Gallbladder wall thickening in the absence of cholelithiasis. 2. Hepatic steatosis and hepatomegaly with additional findings suggestive of hepatic cirrhosis. 3. No flow within the main portal vein which may represent sequelae associated with extensive portal hypertension. Correlation with  contrast-enhanced abdomen pelvis CT is recommended, as portal vein thrombosis cannot be excluded. 4. Perihepatic ascites which may represent the source of the previously noted gallbladder wall thickening.      Disposition Plan  :    Status is: Inpatient  DVT Prophylaxis  :    SCDs Start: 06/11/21 1009  Lab Results  Component Value Date   PLT 88 (L) 06/12/2021    Diet :  Diet Order             Diet regular Room service appropriate? Yes; Fluid consistency: Thin  Diet effective now                    Inpatient Medications  Scheduled Meds:  docusate sodium  100 mg Oral BID   folic acid  1 mg Oral Daily   multivitamin with minerals  1 tablet Oral Daily   nicotine  14 mg Transdermal Daily   potassium chloride  40 mEq Oral Once   prednisoLONE  40 mg Oral Daily   sodium chloride flush  3 mL Intravenous Q12H   thiamine  100 mg Oral Daily   Or   thiamine  100 mg Intravenous Daily   Continuous Infusions:  albumin human     cefTRIAXone (ROCEPHIN)  IV Stopped (06/11/21 1236)   lactated ringers 75 mL/hr at 06/12/21 0646   magnesium sulfate bolus IVPB     potassium chloride 10 mEq (06/12/21 0653)   PRN Meds:.albumin human, bisacodyl, hydrALAZINE, LORazepam **OR** LORazepam, ondansetron **OR** ondansetron (ZOFRAN) IV, oxyCODONE, polyethylene glycol  Antibiotics  :    Anti-infectives (From admission, onward)    Start     Dose/Rate Route Frequency Ordered Stop   06/11/21 1100  cefTRIAXone (ROCEPHIN) 2 g in sodium chloride 0.9 % 100 mL IVPB        2 g 200 mL/hr over 30 Minutes Intravenous Every 24 hours 06/11/21 1019     06/11/21 0530  ciprofloxacin (CIPRO) IVPB 400 mg  Status:  Discontinued        400 mg 200 mL/hr over 60 Minutes Intravenous Every 12 hours 06/11/21 0517 06/11/21 1010   06/11/21 0515  metroNIDAZOLE (FLAGYL) IVPB 500 mg  Status:  Discontinued        500 mg 100 mL/hr over 60 Minutes Intravenous Every 12 hours 06/11/21 0508 06/11/21 1010        Time  Spent in minutes  30   Lala Lund M.D on 06/12/2021 at 8:36 AM  To page go to [email or web address]   [facility] -  Office  [phone]  See all Orders from today for further details    Objective:   Vitals:   06/11/21 1700 06/11/21 2101 06/11/21 2355 06/12/21 0346  BP: 117/78  126/90 (!) 129/91  Pulse: (!) 117     Resp: 13   19  Temp:  98.2 F (36.8 C) 98.3 F (36.8 C) 97.9 F (36.6 C)  TempSrc:  Oral Oral Oral  SpO2: 95%     Weight:      Height:        Wt Readings from Last 3 Encounters:  06/11/21 77.1 kg     Intake/Output Summary (Last 24 hours) at 06/12/2021 0836 Last data filed at 06/12/2021 0646 Gross per 24 hour  Intake 2491.07 ml  Output 600 ml  Net 1891.07 ml     Physical Exam  Awake Alert, No new F.N deficits, Normal affect [place].AT,PERRAL Supple Neck, No JVD,   Symmetrical Chest wall movement, Good air movement bilaterally, CTAB RRR,No Gallops,Rubs or new Murmurs,  +ve B.Sounds, Abd ++ distended, No tenderness,   No Cyanosis, Clubbing or edema        Data Review:    CBC Recent Labs  Lab 06/11/21 0249 06/12/21 0118  WBC 18.2* 12.9*  HGB 11.5* 10.0*  HCT 31.5* 27.9*  PLT 110* 88*  MCV 96.3 96.9  MCH 35.2* 34.7*  MCHC 36.5* 35.8  RDW 14.9 15.5  LYMPHSABS 1.1 1.3  MONOABS 1.2* 0.9  EOSABS 0.0 0.1  BASOSABS 0.0 0.0    Electrolytes Recent Labs  Lab 06/11/21 0249 06/11/21 0454 06/11/21 0841 06/11/21 1738 06/11/21 1951 06/12/21 0118 06/12/21 0640  NA 126*  --   --  127*  --  127*  --   K <2.0*  --   --  2.4*  --  2.2*  --   CL 75*  --   --  80*  --  81*  --   CO2 33*  --   --  34*  --  34*  --   GLUCOSE 152*  --   --  120*  --  116*  --   BUN <5*  --   --  5*  --  6  --   CREATININE 1.09  --   --  0.71  --  0.72  --   CALCIUM 8.8*  --   --  7.7*  --  7.6*  --   AST 196*  --   --   --   --  163*  --   ALT 74*  --   --   --   --  56*  --   ALKPHOS 398*  --   --   --   --  316*  --   BILITOT 24.5*  --   --   --   --   23.7*  --   ALBUMIN 2.2*  --   --   --   --  1.8*  --   MG  --  1.2*  --   --   --  1.7  --   CRP  --   --   --   --   --   --  23.3*  LATICACIDVEN  --  4.4* 3.4* 1.9 2.2*  --   --   INR 1.3*  --   --  1.3*  --  1.4*  --     ------------------------------------------------------------------------------------------------------------------ No results for input(s): CHOL, HDL, LDLCALC, TRIG, CHOLHDL, LDLDIRECT in the last 72 hours.  No results found for: HGBA1C  No results for input(s): TSH, T4TOTAL, T3FREE, THYROIDAB in the last 72 hours.  Invalid input(s): FREET3 ------------------------------------------------------------------------------------------------------------------ ID Labs Recent Labs  Lab 06/11/21 0249 06/11/21 0454 06/11/21 0841 06/11/21 1738 06/11/21 1951 06/12/21 0118 06/12/21 0640  WBC 18.2*  --   --   --   --  12.9*  --   PLT 110*  --   --   --   --  88*  --   CRP  --   --   --   --   --   --  23.3*  LATICACIDVEN  --  4.4* 3.4* 1.9 2.2*  --   --   CREATININE 1.09  --   --  0.71  --  0.72  --    Cardiac Enzymes No results for input(s): CKMB, TROPONINI, MYOGLOBIN in the last 168 hours.  Invalid input(s): CK  Radiology Reports CT ABDOMEN PELVIS W CONTRAST  Result Date: 06/11/2021 CLINICAL DATA:  Liver failure. EXAM: CT ABDOMEN AND PELVIS WITH CONTRAST TECHNIQUE: Multidetector CT imaging of the abdomen and pelvis was performed using the standard protocol following bolus administration of intravenous contrast. RADIATION DOSE REDUCTION: This exam was performed according to the departmental dose-optimization program which includes automated exposure control, adjustment of the mA and/or kV according to patient size and/or use of iterative reconstruction technique. CONTRAST:  56mL OMNIPAQUE IOHEXOL 350 MG/ML SOLN COMPARISON:  None. FINDINGS: Lower chest: No acute abnormality. Hepatobiliary: Hepatomegaly with diffuse hepatic steatosis. Atrophic medial segment of left  hepatic lobe is favored to represent confluent fibrosis in the setting of cirrhosis. Recanalization of the umbilical vein is identified. Gallbladder appears partially decompressed. Pancreas: Unremarkable. No pancreatic ductal dilatation or surrounding inflammatory changes. Spleen: Mild splenomegaly measuring 13.1 cm in length. No focal splenic lesion. Adrenals/Urinary Tract: Normal adrenal glands. The kidneys are unremarkable. No nephrolithiasis or hydronephrosis. Urinary bladder appears normal. Stomach/Bowel: Stomach appears within normal limits. The appendix is visualized and is normal. There is mild circumferential bowel wall edema involving the proximal colon. Marked edema is noted involving the mid and distal sigmoid colon and rectum. No pathologic dilatation of the large or small bowel loops. Vascular/Lymphatic: Aortic atherosclerosis. The upper abdominal vascularity is patent including the intra and extrahepatic portal vein. Small esophageal and gastric varices identified. Recanalization of the umbilical vein. No abdominopelvic adenopathy identified. No abdominopelvic adenopathy. Reproductive: Prostate is unremarkable. Other: Small volume of perihepatic, lower abdominal and pelvic ascites. No discrete fluid collections. Musculoskeletal: No acute or significant osseous findings. IMPRESSION: 1. Morphologic features of the liver compatible with cirrhosis. There is a hepatic steatosis and marked hepatomegaly. Confluent fibrosis of the medial segment of left hepatic lobe noted. 2. Stigmata of portal venous hypertension with recanalized umbilical vein, small esophageal and gastric varices, and small volume of ascites. 3. Mild circumferential bowel wall edema involving the proximal colon with marked edema involving the mid and distal sigmoid colon and rectum. Findings  are nonspecific and may reflect underlying colitis versus hepatic colopathy 4. Aortic Atherosclerosis (ICD10-I70.0). Electronically Signed   By:  Kerby Moors M.D.   On: 06/11/2021 07:47   US Abdomen Limited  Result Date: 06/11/2021 CLINICAL DATA:  Right upper quadrant pain. EXAM: ULTRASOUND ABDOMEN LIMITED RIGHT UPPER QUADRANT COMPARISON:  None. FINDINGS: Gallbladder: No gallstones are visualized. The gallbladder wall is thickened and measures 4.8 mm in thickness. No sonographic Murphy sign noted by sonographer. Common bile duct: Diameter: 3.1 mm Liver: The liver is enlarged and measures approximately 23.2 cm in length. No focal lesion identified. The liver parenchyma is nodular in contour and diffusely increased in echogenicity. No flow is identified within the portal vein on color Doppler evaluation. Other: A mild amount of ascites is seen adjacent to the liver. IMPRESSION: 1. Gallbladder wall thickening in the absence of cholelithiasis. 2. Hepatic steatosis and hepatomegaly with additional findings suggestive of hepatic cirrhosis. 3. No flow within the main portal vein which may represent sequelae associated with extensive portal hypertension. Correlation with contrast-enhanced abdomen pelvis CT is recommended, as portal vein thrombosis cannot be excluded. 4. Perihepatic ascites which may represent the source of the previously noted gallbladder wall thickening. Electronically Signed   By: Virgina Norfolk M.D.   On: 06/11/2021 03:15

## 2021-06-13 DIAGNOSIS — K7011 Alcoholic hepatitis with ascites: Secondary | ICD-10-CM

## 2021-06-13 LAB — CBC WITH DIFFERENTIAL/PLATELET
Abs Immature Granulocytes: 0.16 10*3/uL — ABNORMAL HIGH (ref 0.00–0.07)
Basophils Absolute: 0 10*3/uL (ref 0.0–0.1)
Basophils Relative: 0 %
Eosinophils Absolute: 0 10*3/uL (ref 0.0–0.5)
Eosinophils Relative: 0 %
HCT: 29.2 % — ABNORMAL LOW (ref 39.0–52.0)
Hemoglobin: 10.3 g/dL — ABNORMAL LOW (ref 13.0–17.0)
Immature Granulocytes: 1 %
Lymphocytes Relative: 8 %
Lymphs Abs: 1 10*3/uL (ref 0.7–4.0)
MCH: 34.6 pg — ABNORMAL HIGH (ref 26.0–34.0)
MCHC: 35.3 g/dL (ref 30.0–36.0)
MCV: 98 fL (ref 80.0–100.0)
Monocytes Absolute: 1.1 10*3/uL — ABNORMAL HIGH (ref 0.1–1.0)
Monocytes Relative: 8 %
Neutro Abs: 10.9 10*3/uL — ABNORMAL HIGH (ref 1.7–7.7)
Neutrophils Relative %: 83 %
Platelets: 106 10*3/uL — ABNORMAL LOW (ref 150–400)
RBC: 2.98 MIL/uL — ABNORMAL LOW (ref 4.22–5.81)
RDW: 15.8 % — ABNORMAL HIGH (ref 11.5–15.5)
WBC: 13.1 10*3/uL — ABNORMAL HIGH (ref 4.0–10.5)
nRBC: 0.2 % (ref 0.0–0.2)

## 2021-06-13 LAB — COMPREHENSIVE METABOLIC PANEL
ALT: 67 U/L — ABNORMAL HIGH (ref 0–44)
AST: 182 U/L — ABNORMAL HIGH (ref 15–41)
Albumin: 1.9 g/dL — ABNORMAL LOW (ref 3.5–5.0)
Alkaline Phosphatase: 335 U/L — ABNORMAL HIGH (ref 38–126)
Anion gap: 12 (ref 5–15)
BUN: 7 mg/dL (ref 6–20)
CO2: 29 mmol/L (ref 22–32)
Calcium: 7.8 mg/dL — ABNORMAL LOW (ref 8.9–10.3)
Chloride: 87 mmol/L — ABNORMAL LOW (ref 98–111)
Creatinine, Ser: 0.59 mg/dL — ABNORMAL LOW (ref 0.61–1.24)
GFR, Estimated: >60 mL/min (ref >60)
Glucose, Bld: 161 mg/dL — ABNORMAL HIGH (ref 70–99)
Potassium: 3.4 mmol/L — ABNORMAL LOW (ref 3.5–5.1)
Sodium: 128 mmol/L — ABNORMAL LOW (ref 135–145)
Total Bilirubin: 24.6 mg/dL (ref 0.3–1.2)
Total Protein: 6.1 g/dL — ABNORMAL LOW (ref 6.5–8.1)

## 2021-06-13 LAB — TYPE AND SCREEN
ABO/RH(D): A NEG
Antibody Screen: NEGATIVE

## 2021-06-13 LAB — BRAIN NATRIURETIC PEPTIDE: B Natriuretic Peptide: 187.1 pg/mL — ABNORMAL HIGH (ref 0.0–100.0)

## 2021-06-13 LAB — PROTIME-INR
INR: 1.4 — ABNORMAL HIGH (ref 0.8–1.2)
Prothrombin Time: 17 seconds — ABNORMAL HIGH (ref 11.4–15.2)

## 2021-06-13 LAB — PROCALCITONIN: Procalcitonin: 0.51 ng/mL

## 2021-06-13 LAB — MAGNESIUM: Magnesium: 2.1 mg/dL (ref 1.7–2.4)

## 2021-06-13 LAB — C-REACTIVE PROTEIN: CRP: 19.6 mg/dL — ABNORMAL HIGH (ref <1.0)

## 2021-06-13 MED ORDER — SPIRONOLACTONE 25 MG PO TABS
50.0000 mg | ORAL_TABLET | Freq: Every day | ORAL | Status: DC
  Administered 2021-06-14 – 2021-06-15 (×2): 50 mg via ORAL
  Filled 2021-06-13 (×2): qty 2

## 2021-06-13 MED ORDER — FUROSEMIDE 20 MG PO TABS: 20.0000 mg | ORAL_TABLET | Freq: Every day | ORAL | Status: DC

## 2021-06-13 MED ORDER — FUROSEMIDE 40 MG PO TABS
40.0000 mg | ORAL_TABLET | Freq: Once | ORAL | Status: AC
  Administered 2021-06-13: 40 mg via ORAL
  Filled 2021-06-13: qty 1

## 2021-06-13 MED ORDER — CARVEDILOL 3.125 MG PO TABS
3.1250 mg | ORAL_TABLET | Freq: Two times a day (BID) | ORAL | Status: DC
  Administered 2021-06-13 – 2021-06-15 (×4): 3.125 mg via ORAL
  Filled 2021-06-13 (×4): qty 1

## 2021-06-13 MED ORDER — HYDRALAZINE HCL 20 MG/ML IJ SOLN: 10.0000 mg | INTRAMUSCULAR | Status: DC | PRN

## 2021-06-13 MED ORDER — POTASSIUM CHLORIDE CRYS ER 20 MEQ PO TBCR
20.0000 meq | EXTENDED_RELEASE_TABLET | Freq: Once | ORAL | Status: AC
  Administered 2021-06-13: 20 meq via ORAL
  Filled 2021-06-13: qty 1

## 2021-06-13 MED ORDER — LACTATED RINGERS IV SOLN: INTRAVENOUS | Status: DC

## 2021-06-13 MED ORDER — POTASSIUM CHLORIDE CRYS ER 20 MEQ PO TBCR
40.0000 meq | EXTENDED_RELEASE_TABLET | Freq: Once | ORAL | Status: AC
  Administered 2021-06-13: 40 meq via ORAL
  Filled 2021-06-13: qty 2

## 2021-06-13 NOTE — TOC Initial Note (Signed)
Transition of Care [facility]) - Initial/Assessment Note    Patient Details  Name: Thomas Snyder MRN: [id number] Date of Birth: Dec 10, 1980  Transition of [facility]) CM/SW Contact:    Cyndi Bender, RN Phone Number: 06/13/2021, 4:19 PM  Clinical Narrative:                 Spoke to patient regarding no active PCP. Patient is agreeable for TOC to make him an apt since he has a new insurance. This RNCM sent a request to [facility] assistant to make a PCP apt. TOC will continue to follow for needs.    Expected Discharge Plan: Home/Self Care Barriers to Discharge: Continued Medical Work up   Patient Goals and CMS Choice Patient states their goals for this hospitalization and ongoing recovery are:: return home      Expected Discharge Plan and Services Expected Discharge Plan: Home/Self Care       Living arrangements for the past 2 months: Apartment                                      Prior Living Arrangements/Services Living arrangements for the past 2 months: Apartment Lives with:: Self Patient language and need for interpreter reviewed:: Yes Do you feel safe going back to the place where you live?: Yes            Criminal Activity/Legal Involvement Pertinent to Current Situation/Hospitalization: No - Comment as needed  Activities of Daily Living Home Assistive Devices/Equipment: None ADL Screening (condition at time of admission) Patient's cognitive ability adequate to safely complete daily activities?: Yes Is the patient deaf or have difficulty hearing?: No Does the patient have difficulty seeing, even when wearing glasses/contacts?: No Does the patient have difficulty concentrating, remembering, or making decisions?: No Patient able to express need for assistance with ADLs?: Yes Does the patient have difficulty dressing or bathing?: No Independently performs ADLs?: Yes (appropriate for developmental age) Does the patient have difficulty walking or climbing  stairs?: No Weakness of Legs: None Weakness of Arms/Hands: None  Permission Sought/Granted                  Emotional Assessment   Attitude/Demeanor/Rapport: Engaged Affect (typically observed): Accepting Orientation: : Oriented to Self, Oriented to Place, Oriented to  Time, Oriented to Situation Alcohol / Substance Use: Alcohol Use Psych Involvement: No (comment)  Admission diagnosis:  Acute liver failure [K72.00] Acute liver failure without hepatic coma [K72.00] Patient Active Problem List   Diagnosis Date Noted   Acute liver failure 06/11/2021   Prolonged QT interval 06/11/2021   Hypokalemia 06/11/2021   Alcohol dependence ([place]) 06/11/2021   Tobacco dependence 06/11/2021   Fever 40/98/1191   Alcoholic hepatitis with ascites    Alcoholic cirrhosis of liver with ascites ([place])    Jaundice    PCP:  Pcp, No Pharmacy:   [facility] [place], [place] [address] Phone: [phone] Fax: [phone]  [facility] [address] [place] [address] Phone: [phone] Fax: [phone]     Social Determinants of Health (SDOH) Interventions    Readmission Risk Interventions No flowsheet data found.

## 2021-06-13 NOTE — Progress Notes (Signed)
PROGRESS NOTE                                                                                                                                                                                                             Patient Demographics:    Thomas Snyder, is a 41 y.o. male, DOB - 09-09-1980, UKG:[id number]  Outpatient Primary MD for the patient is Pcp, No    LOS - 2  Admit date - 06/11/2021    Chief Complaint  Patient presents with   Back Pain   Flank Pain       Brief Narrative (HPI from H&P)    Thomas Snyder is a 41 y.o. male with medical history significant of alcohol dependence presenting with back and flank pain, jaundice.  He reports that he drinks daily, usually 1 beer and about 4-5 shots of bourbon (it takes him about a week to drink a bottle).  He binged a lot of alcohol on the day of [employer] but his last alcoholic drink was on 7/62/8315.  In the ER he was diagnosed with suspected acute alcoholic hepatitis, GI was consulted and he was admitted for further care.   Subjective:    Thomas Snyder today has, No headache, No chest pain, No abdominal pain - No Nausea, No new weakness tingling or numbness, no SOB.   Assessment  & Plan :    Acute alcoholic hepatitis with underlying cirrhosis and ongoing alcohol abuse.  He also has evidence of portal hypertension with esophageal varices on CT scan along with ascites, he has been seen by GI currently placed on IV Rocephin for SBP prophylaxis, his discriminant factor is 40 so we will start him on prednisolone, exam shows massive abdominal distention although imaging shows mild to moderate ascites we will try and get ultrasound paracentesis for both therapeutic and diagnostic purposes with IV albumin on board.  Strictly counseled to quit alcohol last drink over a week ago according to the patient.  His iron studies noted will add transferrin level as well and defer to  GI.   Alcohol and tobacco abuse.  Last drink over a week before admission.  Counseled to quit both, on CIWA protocol will monitor.  Alcoholic cirrhosis with underlying thrombocytopenia.  Supportive care.  Low-grade fever and leukocytosis upon admission.  No clear source could be SBP, improving  on Rocephin, paracentesis as above if enough fluid, monitor cultures trend procalcitonin and CRP.  Continue Rocephin.  Hypokalemia.  Aggressively replaced we will continue to monitor.    Hyponatremia - due to volume overload, Lasix and TED stockings, has 2+ edema on 06/13/2021, will continue to monitor.  Likely low albumin and systemic vasodilation due to cirrhosis is making him little intravascularly dehydrated as well.  Most of the fluid is third spaced.  Prolonged QTc.  Improved on 06/13/2021 down to 507, continue to replace electrolytes, hypokalemia much improved.  Nonspecific gallbladder thickening.  General surgery following will monitor.  Could be third spaced fluid.  On Rocephin continue will add few days of oral Flagyl as well.       Condition - Extremely Guarded  Family Communication  :  None present  Code Status :  Full  Consults  :  GI  PUD Prophylaxis : PPI   Procedures  :     CT - 1. Morphologic features of the liver compatible with cirrhosis. There is a hepatic steatosis and marked hepatomegaly. Confluent fibrosis of the medial segment of left hepatic lobe noted. 2. Stigmata of portal venous hypertension with recanalized umbilical vein, small esophageal and gastric varices, and small volume of ascites. 3. Mild circumferential bowel wall edema involving the proximal colon with marked edema involving the mid and distal sigmoid colon and rectum. Findings are nonspecific and may reflect underlying colitis versus hepatic colopathy 4. Aortic Atherosclerosis   [place] - 1. Gallbladder wall thickening in the absence of cholelithiasis. 2. Hepatic steatosis and hepatomegaly with additional  findings suggestive of hepatic cirrhosis. 3. No flow within the main portal vein which may represent sequelae associated with extensive portal hypertension. Correlation with contrast-enhanced abdomen pelvis CT is recommended, as portal vein thrombosis cannot be excluded. 4. Perihepatic ascites which may represent the source of the previously noted gallbladder wall thickening.      Disposition Plan  :    Status is: Inpatient  DVT Prophylaxis  :    Place TED hose Start: 06/13/21 1133 SCDs Start: 06/11/21 1009  Lab Results  Component Value Date   PLT 106 (L) 06/13/2021    Diet :  Diet Order             Diet 2 gram sodium Room service appropriate? Yes with Assist; Fluid consistency: Thin; Fluid restriction: 1500 mL Fluid  Diet effective now                    Inpatient Medications  Scheduled Meds:  docusate sodium  100 mg Oral BID   folic acid  1 mg Oral Daily   furosemide  40 mg Oral Once   metroNIDAZOLE  500 mg Oral Q8H   multivitamin with minerals  1 tablet Oral Daily   nicotine  14 mg Transdermal Daily   pantoprazole  40 mg Oral Daily   prednisoLONE  40 mg Oral Q breakfast   sodium chloride flush  3 mL Intravenous Q12H   thiamine  100 mg Oral Daily   Or   thiamine  100 mg Intravenous Daily   Continuous Infusions:  cefTRIAXone (ROCEPHIN)  IV 2 g (06/13/21 1110)   PRN Meds:.bisacodyl, hydrALAZINE, LORazepam **OR** LORazepam, ondansetron **OR** ondansetron (ZOFRAN) IV, oxyCODONE, polyethylene glycol  Antibiotics  :    Anti-infectives (From admission, onward)    Start     Dose/Rate Route Frequency Ordered Stop   06/12/21 1300  metroNIDAZOLE (FLAGYL) tablet 500 mg  500 mg Oral Every 8 hours 06/12/21 0852     06/11/21 1100  cefTRIAXone (ROCEPHIN) 2 g in sodium chloride 0.9 % 100 mL IVPB        2 g 200 mL/hr over 30 Minutes Intravenous Every 24 hours 06/11/21 1019     06/11/21 0530  ciprofloxacin (CIPRO) IVPB 400 mg  Status:  Discontinued        400  mg 200 mL/hr over 60 Minutes Intravenous Every 12 hours 06/11/21 0517 06/11/21 1010   06/11/21 0515  metroNIDAZOLE (FLAGYL) IVPB 500 mg  Status:  Discontinued        500 mg 100 mL/hr over 60 Minutes Intravenous Every 12 hours 06/11/21 0508 06/11/21 1010        Time Spent in minutes  30   Lala Lund M.D on 06/13/2021 at 11:34 AM  To page go to [email or web address]   [facility] -  Office  [phone]  See all Orders from today for further details    Objective:   Vitals:   06/13/21 0300 06/13/21 0400 06/13/21 0500 06/13/21 0600  BP:  (!) 141/106    Pulse: (!) 112 (!) 117 (!) 110 (!) 119  Resp: 19 18 19  (!) 23  Temp:  98.1 F (36.7 C)    TempSrc:      SpO2: 95% 94% 97% 95%  Weight:      Height:        Wt Readings from Last 3 Encounters:  06/11/21 77.1 kg     Intake/Output Summary (Last 24 hours) at 06/13/2021 1134 Last data filed at 06/13/2021 9381 Gross per 24 hour  Intake 3162.2 ml  Output 975 ml  Net 2187.2 ml     Physical Exam  Awake Alert, No new F.N deficits, Normal affect [place].AT,PERRAL Supple Neck, No JVD,   Symmetrical Chest wall movement, Good air movement bilaterally, CTAB RRR,No Gallops, Rubs or new Murmurs,  +ve B.Sounds, Abd ++ distended, No tenderness,   2+ leg edema    Data Review:    CBC Recent Labs  Lab 06/11/21 0249 06/12/21 0118 06/13/21 0115  WBC 18.2* 12.9* 13.1*  HGB 11.5* 10.0* 10.3*  HCT 31.5* 27.9* 29.2*  PLT 110* 88* 106*  MCV 96.3 96.9 98.0  MCH 35.2* 34.7* 34.6*  MCHC 36.5* 35.8 35.3  RDW 14.9 15.5 15.8*  LYMPHSABS 1.1 1.3 1.0  MONOABS 1.2* 0.9 1.1*  EOSABS 0.0 0.1 0.0  BASOSABS 0.0 0.0 0.0    Electrolytes Recent Labs  Lab 06/11/21 0249 06/11/21 0454 06/11/21 0841 06/11/21 1738 06/11/21 1951 06/12/21 0118 06/12/21 0640 06/12/21 1154 06/12/21 1922 06/13/21 0115  NA 126*  --   --  127*  --  127*  --   --   --  128*  K <2.0*  --   --  2.4*  --  2.2*  --  3.0* 3.9 3.4*  CL 75*  --   --  80*  --   81*  --   --   --  87*  CO2 33*  --   --  34*  --  34*  --   --   --  29  GLUCOSE 152*  --   --  120*  --  116*  --   --   --  161*  BUN <5*  --   --  5*  --  6  --   --   --  7  CREATININE 1.09  --   --  0.71  --  0.72  --   --   --  0.59*  CALCIUM 8.8*  --   --  7.7*  --  7.6*  --   --   --  7.8*  AST 196*  --   --   --   --  163*  --   --   --  182*  ALT 74*  --   --   --   --  56*  --   --   --  67*  ALKPHOS 398*  --   --   --   --  316*  --   --   --  335*  BILITOT 24.5*  --   --   --   --  23.7*  --   --   --  24.6*  ALBUMIN 2.2*  --   --   --   --  1.8*  --   --   --  1.9*  MG  --  1.2*  --   --   --  1.7  --   --   --  2.1  CRP  --   --   --   --   --   --  23.3*  --   --  19.6*  PROCALCITON  --   --   --   --   --   --  0.60  --   --  0.51  LATICACIDVEN  --  4.4* 3.4* 1.9 2.2*  --   --   --   --   --   INR 1.3*  --   --  1.3*  --  1.4*  --   --   --  1.4*  BNP  --   --   --   --   --   --   --   --   --  187.1*    ------------------------------------------------------------------------------------------------------------------ No results for input(s): CHOL, HDL, LDLCALC, TRIG, CHOLHDL, LDLDIRECT in the last 72 hours.  No results found for: HGBA1C  No results for input(s): TSH, T4TOTAL, T3FREE, THYROIDAB in the last 72 hours.  Invalid input(s): FREET3 ------------------------------------------------------------------------------------------------------------------ ID Labs Recent Labs  Lab 06/11/21 0249 06/11/21 0454 06/11/21 0841 06/11/21 1738 06/11/21 1951 06/12/21 0118 06/12/21 0640 06/13/21 0115  WBC 18.2*  --   --   --   --  12.9*  --  13.1*  PLT 110*  --   --   --   --  88*  --  106*  CRP  --   --   --   --   --   --  23.3* 19.6*  PROCALCITON  --   --   --   --   --   --  0.60 0.51  LATICACIDVEN  --  4.4* 3.4* 1.9 2.2*  --   --   --   CREATININE 1.09  --   --  0.71  --  0.72  --  0.59*   Cardiac Enzymes No results for input(s): CKMB, TROPONINI, MYOGLOBIN in  the last 168 hours.  Invalid input(s): CK  Radiology Reports CT ABDOMEN PELVIS W CONTRAST  Result Date: 06/11/2021 CLINICAL DATA:  Liver failure. EXAM: CT ABDOMEN AND PELVIS WITH CONTRAST TECHNIQUE: Multidetector CT imaging of the abdomen and pelvis was performed using the standard protocol following bolus administration of intravenous contrast. RADIATION DOSE REDUCTION: This exam was performed according to the departmental dose-optimization program which includes automated exposure control, adjustment of the mA and/or  kV according to patient size and/or use of iterative reconstruction technique. CONTRAST:  64mL OMNIPAQUE IOHEXOL 350 MG/ML SOLN COMPARISON:  None. FINDINGS: Lower chest: No acute abnormality. Hepatobiliary: Hepatomegaly with diffuse hepatic steatosis. Atrophic medial segment of left hepatic lobe is favored to represent confluent fibrosis in the setting of cirrhosis. Recanalization of the umbilical vein is identified. Gallbladder appears partially decompressed. Pancreas: Unremarkable. No pancreatic ductal dilatation or surrounding inflammatory changes. Spleen: Mild splenomegaly measuring 13.1 cm in length. No focal splenic lesion. Adrenals/Urinary Tract: Normal adrenal glands. The kidneys are unremarkable. No nephrolithiasis or hydronephrosis. Urinary bladder appears normal. Stomach/Bowel: Stomach appears within normal limits. The appendix is visualized and is normal. There is mild circumferential bowel wall edema involving the proximal colon. Marked edema is noted involving the mid and distal sigmoid colon and rectum. No pathologic dilatation of the large or small bowel loops. Vascular/Lymphatic: Aortic atherosclerosis. The upper abdominal vascularity is patent including the intra and extrahepatic portal vein. Small esophageal and gastric varices identified. Recanalization of the umbilical vein. No abdominopelvic adenopathy identified. No abdominopelvic adenopathy. Reproductive: Prostate is  unremarkable. Other: Small volume of perihepatic, lower abdominal and pelvic ascites. No discrete fluid collections. Musculoskeletal: No acute or significant osseous findings. IMPRESSION: 1. Morphologic features of the liver compatible with cirrhosis. There is a hepatic steatosis and marked hepatomegaly. Confluent fibrosis of the medial segment of left hepatic lobe noted. 2. Stigmata of portal venous hypertension with recanalized umbilical vein, small esophageal and gastric varices, and small volume of ascites. 3. Mild circumferential bowel wall edema involving the proximal colon with marked edema involving the mid and distal sigmoid colon and rectum. Findings are nonspecific and may reflect underlying colitis versus hepatic colopathy 4. Aortic Atherosclerosis (ICD10-I70.0). Electronically Signed   By: Kerby Moors M.D.   On: 06/11/2021 07:47   US Abdomen Limited  Result Date: 06/12/2021 CLINICAL DATA:  Ascites.  Evaluate for paracentesis. EXAM: LIMITED ABDOMEN ULTRASOUND FOR ASCITES TECHNIQUE: Limited ultrasound survey for ascites was performed in all four abdominal quadrants. COMPARISON:  CT 06/11/2021 FINDINGS: Trace perihepatic ascites. No significant ascites in the lower quadrants. No significant ascites in the left upper quadrant. IMPRESSION: Trace perihepatic ascites. Paracentesis not performed due to the small amount of fluid. Electronically Signed   By: Markus Daft M.D.   On: 06/12/2021 12:15   US Abdomen Limited  Result Date: 06/11/2021 CLINICAL DATA:  Right upper quadrant pain. EXAM: ULTRASOUND ABDOMEN LIMITED RIGHT UPPER QUADRANT COMPARISON:  None. FINDINGS: Gallbladder: No gallstones are visualized. The gallbladder wall is thickened and measures 4.8 mm in thickness. No sonographic Murphy sign noted by sonographer. Common bile duct: Diameter: 3.1 mm Liver: The liver is enlarged and measures approximately 23.2 cm in length. No focal lesion identified. The liver parenchyma is nodular in contour  and diffusely increased in echogenicity. No flow is identified within the portal vein on color Doppler evaluation. Other: A mild amount of ascites is seen adjacent to the liver. IMPRESSION: 1. Gallbladder wall thickening in the absence of cholelithiasis. 2. Hepatic steatosis and hepatomegaly with additional findings suggestive of hepatic cirrhosis. 3. No flow within the main portal vein which may represent sequelae associated with extensive portal hypertension. Correlation with contrast-enhanced abdomen pelvis CT is recommended, as portal vein thrombosis cannot be excluded. 4. Perihepatic ascites which may represent the source of the previously noted gallbladder wall thickening. Electronically Signed   By: Virgina Norfolk M.D.   On: 06/11/2021 03:15

## 2021-06-13 NOTE — Progress Notes (Signed)
Initial Nutrition Assessment  DOCUMENTATION CODES:   Not applicable  INTERVENTION:   Continue Multivitamin w/ minerals daily Recommend adjusting diet to 2 gm Sodium diet per GI note. Reached out to NP.  NUTRITION DIAGNOSIS:   Increased nutrient needs related to acute illness (cirrhosis) as evidenced by estimated needs.  GOAL:   Patient will meet greater than or equal to 90% of their needs  MONITOR:   PO intake, Labs, Weight trends, Skin  REASON FOR ASSESSMENT:   Consult Assessment of nutrition requirement/status, Other (Comment) (Nutritional Goals)  ASSESSMENT:   41 y.o. male presented to the ED with back/flank pain and jaundice. PMH includes EtOH abuse. Pt admitted with acute liver failure.   Per H&P, pt reports that he usually drinks 1 beer and 4-5 shots of bourbon per day.   Pt reports that his appetite has not been great at home. Pt reports that he works in [employer] and tries to eat majority grilled chicken, but on occasion will have fried chicken. Pt reports that more times than not he feels full quickly after eating.  Pt reports that he has a lactose intolerance, but does not have a milk allergy and would still like to be able to get cheese's. Pt confirmed that he has a shellfish allergy. Denies any nausea or vomiting.  No meal intakes recorded within EMR.  Pt reports that his UBW was 180# 3 years ago and dropped to 145#. Pt believes that he has regained to be closer to his UBW. No weights within EMR to assess weight loss. Pt noted with severe BLE edema and ascites.   Pt denies any ONS use at home, reports that he takes vitamins.   Medications reviewed and include: Colace, Folic acid, MVI, Protonix, Prednisolone, Thiamine, IV antibiotics Labs reviewed:  - Sodium 128 - Potassium 3.4 - Folate 2.9  NUTRITION - FOCUSED PHYSICAL EXAM:  Flowsheet Row Most Recent Value  Orbital Region No depletion  Upper Arm Region No depletion  Thoracic and Lumbar Region No  depletion  Buccal Region No depletion  Temple Region No depletion  Clavicle Bone Region No depletion  Clavicle and Acromion Bone Region No depletion  Scapular Bone Region No depletion  Dorsal Hand No depletion  Patellar Region No depletion  Anterior Thigh Region No depletion  Posterior Calf Region No depletion  Edema (RD Assessment) Severe  Hair Reviewed  Eyes Reviewed  Mouth Reviewed  Skin Reviewed  [Jaundice]  Nails Reviewed       Diet Order:   Diet Order             Diet 2 gram sodium Room service appropriate? Yes with Assist; Fluid consistency: Thin; Fluid restriction: 1500 mL Fluid  Diet effective now                   EDUCATION NEEDS:   No education needs have been identified at this time  Skin:  Skin Assessment: Reviewed RN Assessment  Last BM:  2/18  Height:   Ht Readings from Last 1 Encounters:  06/11/21 5\' 5"  (1.651 m)    Weight:   Wt Readings from Last 1 Encounters:  06/11/21 77.1 kg    Ideal Body Weight:  56.8 kg  BMI:  Body mass index is 28.29 kg/m.  Estimated Nutritional Needs:   Kcal:  2100-2300  Protein:  105-120 grams  Fluid:  >/= 2.1 L    Yasmina [place]ico Louie Casa, RD, LDN Clinical Dietitian See [facility] for contact information.

## 2021-06-13 NOTE — Progress Notes (Addendum)
Progress Note Hospital Day: 3  Chief Complaint:    cirrhosis     ASSESSMENT AND PLAN   Brief History: 41 yo male with chronic etoh abuse admitted yesterday with back / flank pain and jaundice. LFTs markedly elevated. Appears to have etoh hepatitis superimposed on newly diagnosed cirrhosis. He was febrile and tachycardic, possibly related to etoh withdrawal.    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  --[place] screening:  Will need EGD at some pont --[place] screening: No liver lesions reported on CTAP w/ contrast 06/11/21. AFP pending --Complete hepatic serologic workup in progress to rule out other etiologies of cirrhosis. Ferritin elevated, genetic tests ordered.  --insufficient ascites for diagnostic paracentesis. Continue empiric Rocephin.  --Not immune to HAV, should get vaccine at some point. HBV surface ab is pending --Started on Prednisolone 2/29/23 for DF of 40 --Today: Liver chemistries worse overnight. MELD 28.  --INR 1.4 ( no improvement after dose of Vitamin K on 2/18) --Normal mentation --Continue Prednisolone --Developing lower extremity edema. Changing to a 2 grams sodium restricted diet --check am INR, CMP  # Bowel wall thickening on CT >>Mild circumferential bowel wall edema involving the proximal colon with marked edema involving the mid and distal sigmoid colon and rectum. Findings are nonspecific. No diarrhea or symptoms to suggest colitis.  Probably hepatic colopathy   SUBJECTIVE   Feels fine. Was having back pain but that is better after large BM this am.        OBJECTIVE      Scheduled inpatient medications:   docusate sodium  100 mg Oral BID   folic acid  1 mg Oral Daily   metroNIDAZOLE  500 mg Oral Q8H   multivitamin with minerals  1 tablet Oral Daily   nicotine  14 mg Transdermal Daily   pantoprazole  40 mg Oral Daily   prednisoLONE  40 mg Oral Q breakfast   sodium chloride  flush  3 mL Intravenous Q12H   thiamine  100 mg Oral Daily   Or   thiamine  100 mg Intravenous Daily   Continuous inpatient infusions:   cefTRIAXone (ROCEPHIN)  IV Stopped (06/12/21 1508)   lactated ringers 100 mL/hr at 06/13/21 0637   PRN inpatient medications: bisacodyl, hydrALAZINE, LORazepam **OR** LORazepam, ondansetron **OR** ondansetron (ZOFRAN) IV, oxyCODONE, polyethylene glycol  Vital signs in last 24 hours: Temp:  [97.8 F (36.6 C)-98.5 F (36.9 C)] 98.1 F (36.7 C) (02/20 0400) Pulse Rate:  [106-124] 119 (02/20 0600) Resp:  [14-23] 23 (02/20 0600) BP: (120-141)/(86-106) 141/106 (02/20 0400) SpO2:  [84 %-97 %] 95 % (02/20 0600) Last BM Date : 06/11/21  Intake/Output Summary (Last 24 hours) at 06/13/2021 0838 Last data filed at 06/13/2021 5009 Gross per 24 hour  Intake 3162.2 ml  Output 975 ml  Net 2187.2 ml     Physical Exam:  General: Alert male in NAD Heart:  Regular rate and rhythm. Bilateral lower extremity pitting edema Pulmonary: Normal respiratory effort Abdomen: Soft, protuberant, nontender. Normal bowel sounds.  Neurologic: Alert and oriented Psych: Pleasant. Cooperative.   Filed Weights   06/11/21 1031  Weight: 77.1 kg    ntake/Output from previous day: 02/19 0701 - 02/20 0700 In: 3562.2 [P.O.:1600; I.V.:1639.4; IV Piggyback:322.8] Out: 975 [Urine:975] Intake/Output this shift: No intake/output data recorded.   DIAGNOSTIC STUDIES THIS ADMISSION:  US Abdomen Limited  Result Date: 06/12/2021 CLINICAL DATA:  Ascites.  Evaluate for paracentesis. EXAM: LIMITED ABDOMEN ULTRASOUND  FOR ASCITES TECHNIQUE: Limited ultrasound survey for ascites was performed in all four abdominal quadrants. COMPARISON:  CT 06/11/2021 FINDINGS: Trace perihepatic ascites. No significant ascites in the lower quadrants. No significant ascites in the left upper quadrant. IMPRESSION: Trace perihepatic ascites. Paracentesis not performed due to the small amount of fluid.  Electronically Signed   By: Markus Daft M.D.   On: 06/12/2021 12:15       Lab Results: Recent Labs    06/11/21 0249 06/12/21 0118 06/13/21 0115  WBC 18.2* 12.9* 13.1*  HGB 11.5* 10.0* 10.3*  HCT 31.5* 27.9* 29.2*  PLT 110* 88* 106*   BMET Recent Labs    06/11/21 1738 06/12/21 0118 06/12/21 1154 06/12/21 1922 06/13/21 0115  NA 127* 127*  --   --  128*  K 2.4* 2.2* 3.0* 3.9 3.4*  CL 80* 81*  --   --  87*  CO2 34* 34*  --   --  29  GLUCOSE 120* 116*  --   --  161*  BUN 5* 6  --   --  7  CREATININE 0.71 0.72  --   --  0.59*  CALCIUM 7.7* 7.6*  --   --  7.8*   LFT  Latest Reference Range & Units 06/12/21 01:18 06/12/21 06:40 06/12/21 11:54 06/12/21 19:22 06/13/21 01:15  COMPREHENSIVE METABOLIC PANEL  Rpt !!    Rpt !!  Sodium 135 - 145 mmol/L 127 (L)    128 (L)  Potassium 3.5 - 5.1 mmol/L 2.2 (LL)  3.0 (L) 3.9 3.4 (L)  Chloride 98 - 111 mmol/L 81 (L)    87 (L)  CO2 22 - 32 mmol/L 34 (H)    29  Glucose 70 - 99 mg/dL 116 (H)    161 (H)  BUN 6 - 20 mg/dL 6    7  Creatinine 0.61 - 1.24 mg/dL 0.72    0.59 (L)  Calcium 8.9 - 10.3 mg/dL 7.6 (L)    7.8 (L)  Anion gap 5 - 15  12    12   Magnesium 1.7 - 2.4 mg/dL 1.7    2.1  Alkaline Phosphatase 38 - 126 U/L 316 (H)    335 (H)  Albumin 3.5 - 5.0 g/dL 1.8 (L)    1.9 (L)  Uric Acid, Serum 3.7 - 8.6 mg/dL  2.2 (L)     AST 15 - 41 U/L 163 (H)    182 (H)  ALT 0 - 44 U/L 56 (H)    67 (H)  Total Protein 6.5 - 8.1 g/dL 5.8 (L)    6.1 (L)  Total Bilirubin 0.3 - 1.2 mg/dL 23.7 (HH)    24.6 (HH)  GFR, Estimated >60 mL/min >60    >60     PT/INR Recent Labs    06/12/21 0118 06/13/21 0115  LABPROT 16.7* 17.0*  INR 1.4* 1.4*   Hepatitis Panel Recent Labs    06/11/21 0249  HEPBSAG NON REACTIVE  HCVAB NON REACTIVE  HEPAIGM NON REACTIVE  HEPBIGM NON REACTIVE      Principal Problem:   Acute liver failure Active Problems:   Prolonged QT interval   Hypokalemia   Alcohol dependence (HCC)   Tobacco dependence    Fever     LOS: 2 days   Tye Savoy ,NP 06/13/2021, 8:38 AM   Attending physician's note   I have taken history, reviewed the chart and examined the patient. I performed a substantive portion of this encounter, including complete performance of at least one  of the key components, in conjunction with the APP. I agree with the Advanced Practitioner's note, impression and recommendations.   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 MELD-Na 24/ MDF 42 Jaundice with abn LFTs AST:ALT ratio c/w liver disease Minimal ascites on CT Small varices on CT No encephalopathy Continued alcohol abuse. New onset leg edema (likely d/t fluid overload, + 5lit since adm)  Plan -Low-salt diet -Start Lasix/Aldactone (may not need long term) -Trend CBC, CMP and PT/INR -Monitor and correct electrolytes. -Started on prednisolone (2/19) for DF>32.  -EGD likely as outpt. -Work-up for any additional etiology of liver disease in progress. -Would need hep A vaccine as outpt. HBsAb- pending -Strict alcohol abstinence.  He seems to be highly motivated. -Discussed in detail with pt, pt's mom and dad -[facility] APP clinic in 1 week, labs prior to calculate [place] score. -If better, can D/C home in AM.    Carmell Austria, MD [facility] [phone]

## 2021-06-14 LAB — COMPREHENSIVE METABOLIC PANEL
ALT: 73 U/L — ABNORMAL HIGH (ref 0–44)
AST: 174 U/L — ABNORMAL HIGH (ref 15–41)
Albumin: 1.9 g/dL — ABNORMAL LOW (ref 3.5–5.0)
Alkaline Phosphatase: 335 U/L — ABNORMAL HIGH (ref 38–126)
Anion gap: 10 (ref 5–15)
BUN: 8 mg/dL (ref 6–20)
CO2: 29 mmol/L (ref 22–32)
Calcium: 7.6 mg/dL — ABNORMAL LOW (ref 8.9–10.3)
Chloride: 90 mmol/L — ABNORMAL LOW (ref 98–111)
Creatinine, Ser: 0.73 mg/dL (ref 0.61–1.24)
GFR, Estimated: >60 mL/min (ref >60)
Glucose, Bld: 188 mg/dL — ABNORMAL HIGH (ref 70–99)
Potassium: 3.4 mmol/L — ABNORMAL LOW (ref 3.5–5.1)
Sodium: 129 mmol/L — ABNORMAL LOW (ref 135–145)
Total Bilirubin: 24.6 mg/dL (ref 0.3–1.2)
Total Protein: 6.1 g/dL — ABNORMAL LOW (ref 6.5–8.1)

## 2021-06-14 LAB — CBC WITH DIFFERENTIAL/PLATELET
Abs Immature Granulocytes: 0.81 10*3/uL — ABNORMAL HIGH (ref 0.00–0.07)
Basophils Absolute: 0 10*3/uL (ref 0.0–0.1)
Basophils Relative: 0 %
Eosinophils Absolute: 0 10*3/uL (ref 0.0–0.5)
Eosinophils Relative: 0 %
HCT: 29.7 % — ABNORMAL LOW (ref 39.0–52.0)
Hemoglobin: 10.4 g/dL — ABNORMAL LOW (ref 13.0–17.0)
Immature Granulocytes: 5 %
Lymphocytes Relative: 8 %
Lymphs Abs: 1.4 10*3/uL (ref 0.7–4.0)
MCH: 34.6 pg — ABNORMAL HIGH (ref 26.0–34.0)
MCHC: 35 g/dL (ref 30.0–36.0)
MCV: 98.7 fL (ref 80.0–100.0)
Monocytes Absolute: 1.5 10*3/uL — ABNORMAL HIGH (ref 0.1–1.0)
Monocytes Relative: 9 %
Neutro Abs: 13.8 10*3/uL — ABNORMAL HIGH (ref 1.7–7.7)
Neutrophils Relative %: 78 %
Platelets: 142 10*3/uL — ABNORMAL LOW (ref 150–400)
RBC: 3.01 MIL/uL — ABNORMAL LOW (ref 4.22–5.81)
RDW: 16.2 % — ABNORMAL HIGH (ref 11.5–15.5)
WBC: 17.6 10*3/uL — ABNORMAL HIGH (ref 4.0–10.5)
nRBC: 0.3 % — ABNORMAL HIGH (ref 0.0–0.2)

## 2021-06-14 LAB — ANA W/REFLEX IF POSITIVE
Anti Nuclear Antibody (ANA): NEGATIVE
Anti Nuclear Antibody (ANA): NEGATIVE

## 2021-06-14 LAB — UREA NITROGEN, URINE: Urea Nitrogen, Ur: 850 mg/dL

## 2021-06-14 LAB — BRAIN NATRIURETIC PEPTIDE: B Natriuretic Peptide: 169.1 pg/mL — ABNORMAL HIGH (ref 0.0–100.0)

## 2021-06-14 LAB — GLUCOSE, CAPILLARY: Glucose-Capillary: 168 mg/dL — ABNORMAL HIGH (ref 70–99)

## 2021-06-14 LAB — MAGNESIUM: Magnesium: 1.8 mg/dL (ref 1.7–2.4)

## 2021-06-14 LAB — PROTIME-INR
INR: 1.4 — ABNORMAL HIGH (ref 0.8–1.2)
Prothrombin Time: 16.7 seconds — ABNORMAL HIGH (ref 11.4–15.2)

## 2021-06-14 LAB — CERULOPLASMIN: Ceruloplasmin: 30.7 mg/dL (ref 16.0–31.0)

## 2021-06-14 LAB — ALPHA-1-ANTITRYPSIN: A-1 Antitrypsin, Ser: 266 mg/dL — ABNORMAL HIGH (ref 95–164)

## 2021-06-14 LAB — PROCALCITONIN: Procalcitonin: 0.42 ng/mL

## 2021-06-14 LAB — MITOCHONDRIAL ANTIBODIES: Mitochondrial M2 Ab, IgG: <20 Units (ref 0.0–20.0)

## 2021-06-14 LAB — ANTI-SMOOTH MUSCLE ANTIBODY, IGG: F-Actin IgG: 10 Units (ref 0–19)

## 2021-06-14 LAB — IGG: IgG (Immunoglobin G), Serum: 1012 mg/dL (ref 603–1613)

## 2021-06-14 LAB — HEPATITIS B SURFACE ANTIBODY, QUANTITATIVE: Hep B S AB Quant (Post): <3.1 m[IU]/mL — ABNORMAL LOW

## 2021-06-14 LAB — AFP TUMOR MARKER: AFP, Serum, Tumor Marker: 2.5 ng/mL (ref 0.0–6.9)

## 2021-06-14 LAB — C-REACTIVE PROTEIN: CRP: 15.1 mg/dL — ABNORMAL HIGH (ref <1.0)

## 2021-06-14 MED ORDER — FUROSEMIDE 40 MG PO TABS
40.0000 mg | ORAL_TABLET | Freq: Every day | ORAL | Status: DC
Start: 2021-06-14 — End: 2021-06-15
  Administered 2021-06-14 – 2021-06-15 (×2): 40 mg via ORAL
  Filled 2021-06-14 (×2): qty 1

## 2021-06-14 MED ORDER — POTASSIUM CHLORIDE CRYS ER 20 MEQ PO TBCR
40.0000 meq | EXTENDED_RELEASE_TABLET | Freq: Two times a day (BID) | ORAL | Status: AC
  Administered 2021-06-14 (×2): 40 meq via ORAL
  Filled 2021-06-14 (×2): qty 2

## 2021-06-14 MED ORDER — POTASSIUM CHLORIDE CRYS ER 20 MEQ PO TBCR
20.0000 meq | EXTENDED_RELEASE_TABLET | Freq: Once | ORAL | Status: AC
  Administered 2021-06-14: 20 meq via ORAL
  Filled 2021-06-14: qty 1

## 2021-06-14 NOTE — Progress Notes (Signed)
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 MELD-Na 24/ MDF 42 Jaundice with abn LFTs AST:ALT ratio c/w liver disease Minimal ascites on CT Small varices on CT No encephalopathy Continued alcohol abuse. New onset leg edema (likely d/t fluid overload, + 5lit since adm)   Plan -Low-salt Nl protein diet -D/C rocephin/metro. -Continue Lasix/Aldactone/protonix -Continue coreg. -Started on prednisolone (2/19) for DF>32.  -EGD likely as outpt. -Work-up for any additional etiology of liver disease in progress. -Would need hep A vaccine as outpt. HBsAb- pending -Strict alcohol abstinence.  He seems to be highly motivated. -[facility] GI APP clinic in 1 week, labs prior to calculate [place] score. -If better, can D/C home in AM. -Will sign off for now. Expect jaundice to gradually get better over next 12 weeks.     SUBJECTIVE   Feels better. Eating much better No nausea or vomiting. Highly motivated to quit alcohol.    OBJECTIVE:     Vital signs in last 24 hours: Temp:  [97.2 F (36.2 C)-98.6 F (37 C)] 98 F (36.7 C) (02/21 1625) Pulse Rate:  [104-124] 109 (02/21 1625) Resp:  [14-25] 14 (02/21 1625) BP: (114-148)/(83-104) 148/98 (02/21 1625) SpO2:  [89 %-97 %] 93 % (02/21 1625) Last BM Date : 06/13/21 General:   Alert, well-developed male in NAD EENT:  icteric sclera, conjunctive pink.  Heart:  Regular rate and rhythm; no murmur.  No lower extremity edema   Pulm: Normal respiratory effort, lungs CTA bilaterally without wheezes or crackles. Abdomen:  Soft, nondistended, nontender.  Normal bowel sounds,.       Neurologic:  Alert and  oriented x4;  grossly normal neurologically. Psych:  Pleasant, cooperative.  Normal mood and affect. Ext- 1+ edema   Intake/Output from previous day: No intake/output data recorded. Intake/Output this shift: No intake/output data recorded.  Lab Results: Recent  Labs    06/12/21 0118 06/13/21 0115 06/14/21 0042  WBC 12.9* 13.1* 17.6*  HGB 10.0* 10.3* 10.4*  HCT 27.9* 29.2* 29.7*  PLT 88* 106* 142*   BMET Recent Labs    06/12/21 0118 06/12/21 1154 06/12/21 1922 06/13/21 0115 06/14/21 0042  NA 127*  --   --  128* 129*  K 2.2*   < > 3.9 3.4* 3.4*  CL 81*  --   --  87* 90*  CO2 34*  --   --  29 29  GLUCOSE 116*  --   --  161* 188*  BUN 6  --   --  7 8  CREATININE 0.72  --   --  0.59* 0.73  CALCIUM 7.6*  --   --  7.8* 7.6*   < > = values in this interval not displayed.   LFT Recent Labs    06/14/21 0042  PROT 6.1*  ALBUMIN 1.9*  AST 174*  ALT 73*  ALKPHOS 335*  BILITOT 24.6*   PT/INR Recent Labs    06/13/21 0115 06/14/21 0042  LABPROT 17.0* 16.7*  INR 1.4* 1.4*   Hepatitis Panel No results for input(s): HEPBSAG, HCVAB, HEPAIGM, HEPBIGM in the last 72 hours.  No results found.   Principal Problem:   Acute liver failure Active Problems:   Prolonged QT interval   Hypokalemia   Alcohol dependence ([place])   Tobacco dependence   Fever     LOS: 3 days     Carmell Austria, MD 06/14/2021, 5:07 PM [facility] [phone]

## 2021-06-14 NOTE — Progress Notes (Addendum)
PROGRESS NOTE                                                                                                                                                                                                             Patient Demographics:    Thomas Snyder, is a 41 y.o. male, DOB - Oct 06, 1980, [id number]:[id number]  Outpatient Primary MD for the patient is Pcp, No    LOS - 3  Admit date - 06/11/2021    Chief Complaint  Patient presents with   Back Pain   Flank Pain       Brief Narrative (HPI from H&P)    Thomas Snyder is a 41 y.o. male with medical history significant of alcohol dependence presenting with back and flank pain, jaundice.  He reports that he drinks daily, usually 1 beer and about 4-5 shots of bourbon (it takes him about a week to drink a bottle).  He binged a lot of alcohol on the day of [employer] but his last alcoholic drink was on 4/48/1856.  In the ER he was diagnosed with suspected acute alcoholic hepatitis, GI was consulted and he was admitted for further care.   Subjective:   Patient in bed, appears comfortable, denies any headache, no fever, no chest pain or pressure, no shortness of breath , no abdominal pain. No focal weakness.   Assessment  & Plan :    Acute alcoholic hepatitis with underlying cirrhosis and ongoing alcohol abuse.  He also has evidence of portal hypertension with esophageal varices on CT scan along with ascites, he has been seen by GI currently placed on IV Rocephin along with oral Flagyl for SBP prophylaxis and nonspecific gallbladder findings on CT scan, his discriminant factor is 40 so we will start him on prednisolone, exam shows massive abdominal distention although imaging shows mild fluid collection not enough for paracentesis discussed with IR.  Strictly counseled to quit alcohol last drink over a week ago according to the patient.  His iron studies noted will add transferrin  level as well and defer to GI.  For now continue diuresis with Lasix and Aldactone, defer further management of this issue to GI.   Alcohol and tobacco abuse.  Last drink over a week before admission.  Counseled to quit both, on CIWA protocol will monitor.  Alcoholic cirrhosis with underlying thrombocytopenia.  Supportive care.  Low-grade fever and leukocytosis upon admission.  No clear source could be SBP, improving on Rocephin, paracentesis as above could not be done as he had not enough fluid, monitor cultures trend procalcitonin and CRP.  Continue Rocephin.  Hypokalemia.  Aggressively replaced we will continue to monitor.    Hyponatremia - due to volume overload, Lasix, Aldactone and TED stockings along with fluid restriction.  Prolonged QTc.  Improved on 06/13/2021 down to 507, likely due to hypokalemia, QTc on 06/14/2021 after stable potassium and magnesium levels is 380 ms on telemetry.  Nonspecific gallbladder thickening.  General surgery following will monitor.  Could be third spaced fluid.  On Rocephin continue will add few days of oral Flagyl as well.       Condition - Extremely Guarded  Family Communication  :  None present  Code Status :  Full  Consults  :  GI  PUD Prophylaxis : PPI   Procedures  :     CT - 1. Morphologic features of the liver compatible with cirrhosis. There is a hepatic steatosis and marked hepatomegaly. Confluent fibrosis of the medial segment of left hepatic lobe noted. 2. Stigmata of portal venous hypertension with recanalized umbilical vein, small esophageal and gastric varices, and small volume of ascites. 3. Mild circumferential bowel wall edema involving the proximal colon with marked edema involving the mid and distal sigmoid colon and rectum. Findings are nonspecific and may reflect underlying colitis versus hepatic colopathy 4. Aortic Atherosclerosis   [place] - 1. Gallbladder wall thickening in the absence of cholelithiasis. 2. Hepatic steatosis  and hepatomegaly with additional findings suggestive of hepatic cirrhosis. 3. No flow within the main portal vein which may represent sequelae associated with extensive portal hypertension. Correlation with contrast-enhanced abdomen pelvis CT is recommended, as portal vein thrombosis cannot be excluded. 4. Perihepatic ascites which may represent the source of the previously noted gallbladder wall thickening.      Disposition Plan  :    Status is: Inpatient  DVT Prophylaxis  :    Place TED hose Start: 06/13/21 1133 SCDs Start: 06/11/21 1009  Lab Results  Component Value Date   PLT 142 (L) 06/14/2021    Diet :  Diet Order             Diet 2 gram sodium Room service appropriate? Yes with Assist; Fluid consistency: Thin; Fluid restriction: 1500 mL Fluid  Diet effective now                    Inpatient Medications  Scheduled Meds:  carvedilol  3.125 mg Oral BID WC   folic acid  1 mg Oral Daily   furosemide  40 mg Oral Daily   metroNIDAZOLE  500 mg Oral Q8H   multivitamin with minerals  1 tablet Oral Daily   nicotine  14 mg Transdermal Daily   pantoprazole  40 mg Oral Daily   potassium chloride  40 mEq Oral BID   prednisoLONE  40 mg Oral Q breakfast   sodium chloride flush  3 mL Intravenous Q12H   spironolactone  50 mg Oral Daily   thiamine  100 mg Oral Daily   Or   thiamine  100 mg Intravenous Daily   Continuous Infusions:  cefTRIAXone (ROCEPHIN)  IV 2 g (06/13/21 1110)   PRN Meds:.bisacodyl, hydrALAZINE, ondansetron **OR** ondansetron (ZOFRAN) IV, oxyCODONE, polyethylene glycol  Antibiotics  :    Anti-infectives (From admission, onward)    Start  Dose/Rate Route Frequency Ordered Stop   06/12/21 1300  metroNIDAZOLE (FLAGYL) tablet 500 mg        500 mg Oral Every 8 hours 06/12/21 0852     06/11/21 1100  cefTRIAXone (ROCEPHIN) 2 g in sodium chloride 0.9 % 100 mL IVPB        2 g 200 mL/hr over 30 Minutes Intravenous Every 24 hours 06/11/21 1019     06/11/21  0530  ciprofloxacin (CIPRO) IVPB 400 mg  Status:  Discontinued        400 mg 200 mL/hr over 60 Minutes Intravenous Every 12 hours 06/11/21 0517 06/11/21 1010   06/11/21 0515  metroNIDAZOLE (FLAGYL) IVPB 500 mg  Status:  Discontinued        500 mg 100 mL/hr over 60 Minutes Intravenous Every 12 hours 06/11/21 0508 06/11/21 1010        Time Spent in minutes  30   Lala Lund M.D on 06/14/2021 at 10:59 AM  To page go to [email or web address]   [facility] -  Office  [phone]  See all Orders from today for further details    Objective:   Vitals:   06/13/21 2322 06/14/21 0400 06/14/21 0804 06/14/21 0931  BP: (!) 129/99 (!) 133/96 (!) 135/104 (!) 115/93  Pulse: (!) 104 (!) 107 (!) 124 (!) 109  Resp: 17 19 (!) 25 20  Temp: 97.7 F (36.5 C) 98 F (36.7 C) 97.6 F (36.4 C) 98.4 F (36.9 C)  TempSrc: Axillary Oral Oral Oral  SpO2: 95% 95% 92% 96%  Weight:      Height:        Wt Readings from Last 3 Encounters:  06/11/21 77.1 kg    No intake or output data in the 24 hours ending 06/14/21 1059    Physical Exam  Awake Alert, No new F.N deficits, Normal affect [place].AT,PERRAL Supple Neck, No JVD,   Symmetrical Chest wall movement, Good air movement bilaterally, CTAB RRR,No Gallops, Rubs or new Murmurs,  Abdomen is distended but nontender, 2+ leg edema   Data Review:    CBC Recent Labs  Lab 06/11/21 0249 06/12/21 0118 06/13/21 0115 06/14/21 0042  WBC 18.2* 12.9* 13.1* 17.6*  HGB 11.5* 10.0* 10.3* 10.4*  HCT 31.5* 27.9* 29.2* 29.7*  PLT 110* 88* 106* 142*  MCV 96.3 96.9 98.0 98.7  MCH 35.2* 34.7* 34.6* 34.6*  MCHC 36.5* 35.8 35.3 35.0  RDW 14.9 15.5 15.8* 16.2*  LYMPHSABS 1.1 1.3 1.0 1.4  MONOABS 1.2* 0.9 1.1* 1.5*  EOSABS 0.0 0.1 0.0 0.0  BASOSABS 0.0 0.0 0.0 0.0    Electrolytes Recent Labs  Lab 06/11/21 0249 06/11/21 0454 06/11/21 0841 06/11/21 1738 06/11/21 1951 06/12/21 0118 06/12/21 0640 06/12/21 1154 06/12/21 1922 06/13/21 0115  06/14/21 0042  NA 126*  --   --  127*  --  127*  --   --   --  128* 129*  K <2.0*  --   --  2.4*  --  2.2*  --  3.0* 3.9 3.4* 3.4*  CL 75*  --   --  80*  --  81*  --   --   --  87* 90*  CO2 33*  --   --  34*  --  34*  --   --   --  29 29  GLUCOSE 152*  --   --  120*  --  116*  --   --   --  161* 188*  BUN <5*  --   --  5*  --  6  --   --   --  7 8  CREATININE 1.09  --   --  0.71  --  0.72  --   --   --  0.59* 0.73  CALCIUM 8.8*  --   --  7.7*  --  7.6*  --   --   --  7.8* 7.6*  AST 196*  --   --   --   --  163*  --   --   --  182* 174*  ALT 74*  --   --   --   --  56*  --   --   --  67* 73*  ALKPHOS 398*  --   --   --   --  316*  --   --   --  335* 335*  BILITOT 24.5*  --   --   --   --  23.7*  --   --   --  24.6* 24.6*  ALBUMIN 2.2*  --   --   --   --  1.8*  --   --   --  1.9* 1.9*  MG  --  1.2*  --   --   --  1.7  --   --   --  2.1 1.8  CRP  --   --   --   --   --   --  23.3*  --   --  19.6* 15.1*  PROCALCITON  --   --   --   --   --   --  0.60  --   --  0.51 0.42  LATICACIDVEN  --  4.4* 3.4* 1.9 2.2*  --   --   --   --   --   --   INR 1.3*  --   --  1.3*  --  1.4*  --   --   --  1.4* 1.4*  BNP  --   --   --   --   --   --   --   --   --  187.1* 169.1*    ------------------------------------------------------------------------------------------------------------------ No results for input(s): CHOL, HDL, LDLCALC, TRIG, CHOLHDL, LDLDIRECT in the last 72 hours.  No results found for: HGBA1C  No results for input(s): TSH, T4TOTAL, T3FREE, THYROIDAB in the last 72 hours.  Invalid input(s): FREET3 ------------------------------------------------------------------------------------------------------------------ ID Labs Recent Labs  Lab 06/11/21 0249 06/11/21 0454 06/11/21 0841 06/11/21 1738 06/11/21 1951 06/12/21 0118 06/12/21 0640 06/13/21 0115 06/14/21 0042  WBC 18.2*  --   --   --   --  12.9*  --  13.1* 17.6*  PLT 110*  --   --   --   --  88*  --  106* 142*  CRP  --   --    --   --   --   --  23.3* 19.6* 15.1*  PROCALCITON  --   --   --   --   --   --  0.60 0.51 0.42  LATICACIDVEN  --  4.4* 3.4* 1.9 2.2*  --   --   --   --   CREATININE 1.09  --   --  0.71  --  0.72  --  0.59* 0.73   Cardiac Enzymes No results for input(s): CKMB, TROPONINI, MYOGLOBIN in the last 168 hours.  Invalid input(s): CK  Radiology Reports CT ABDOMEN PELVIS W CONTRAST  Result Date: 06/11/2021 CLINICAL DATA:  Liver failure. EXAM: CT ABDOMEN AND PELVIS WITH CONTRAST TECHNIQUE: Multidetector CT imaging of the abdomen and pelvis was performed using the standard protocol following bolus administration of intravenous contrast. RADIATION DOSE REDUCTION: This exam was performed according to the departmental dose-optimization program which includes automated exposure control, adjustment of the mA and/or kV according to patient size and/or use of iterative reconstruction technique. CONTRAST:  14mL OMNIPAQUE IOHEXOL 350 MG/ML SOLN COMPARISON:  None. FINDINGS: Lower chest: No acute abnormality. Hepatobiliary: Hepatomegaly with diffuse hepatic steatosis. Atrophic medial segment of left hepatic lobe is favored to represent confluent fibrosis in the setting of cirrhosis. Recanalization of the umbilical vein is identified. Gallbladder appears partially decompressed. Pancreas: Unremarkable. No pancreatic ductal dilatation or surrounding inflammatory changes. Spleen: Mild splenomegaly measuring 13.1 cm in length. No focal splenic lesion. Adrenals/Urinary Tract: Normal adrenal glands. The kidneys are unremarkable. No nephrolithiasis or hydronephrosis. Urinary bladder appears normal. Stomach/Bowel: Stomach appears within normal limits. The appendix is visualized and is normal. There is mild circumferential bowel wall edema involving the proximal colon. Marked edema is noted involving the mid and distal sigmoid colon and rectum. No pathologic dilatation of the large or small bowel loops. Vascular/Lymphatic: Aortic  atherosclerosis. The upper abdominal vascularity is patent including the intra and extrahepatic portal vein. Small esophageal and gastric varices identified. Recanalization of the umbilical vein. No abdominopelvic adenopathy identified. No abdominopelvic adenopathy. Reproductive: Prostate is unremarkable. Other: Small volume of perihepatic, lower abdominal and pelvic ascites. No discrete fluid collections. Musculoskeletal: No acute or significant osseous findings. IMPRESSION: 1. Morphologic features of the liver compatible with cirrhosis. There is a hepatic steatosis and marked hepatomegaly. Confluent fibrosis of the medial segment of left hepatic lobe noted. 2. Stigmata of portal venous hypertension with recanalized umbilical vein, small esophageal and gastric varices, and small volume of ascites. 3. Mild circumferential bowel wall edema involving the proximal colon with marked edema involving the mid and distal sigmoid colon and rectum. Findings are nonspecific and may reflect underlying colitis versus hepatic colopathy 4. Aortic Atherosclerosis (ICD10-I70.0). Electronically Signed   By: Kerby Moors M.D.   On: 06/11/2021 07:47   US Abdomen Limited  Result Date: 06/12/2021 CLINICAL DATA:  Ascites.  Evaluate for paracentesis. EXAM: LIMITED ABDOMEN ULTRASOUND FOR ASCITES TECHNIQUE: Limited ultrasound survey for ascites was performed in all four abdominal quadrants. COMPARISON:  CT 06/11/2021 FINDINGS: Trace perihepatic ascites. No significant ascites in the lower quadrants. No significant ascites in the left upper quadrant. IMPRESSION: Trace perihepatic ascites. Paracentesis not performed due to the small amount of fluid. Electronically Signed   By: Markus Daft M.D.   On: 06/12/2021 12:15   US Abdomen Limited  Result Date: 06/11/2021 CLINICAL DATA:  Right upper quadrant pain. EXAM: ULTRASOUND ABDOMEN LIMITED RIGHT UPPER QUADRANT COMPARISON:  None. FINDINGS: Gallbladder: No gallstones are visualized. The  gallbladder wall is thickened and measures 4.8 mm in thickness. No sonographic Murphy sign noted by sonographer. Common bile duct: Diameter: 3.1 mm Liver: The liver is enlarged and measures approximately 23.2 cm in length. No focal lesion identified. The liver parenchyma is nodular in contour and diffusely increased in echogenicity. No flow is identified within the portal vein on color Doppler evaluation. Other: A mild amount of ascites is seen adjacent to the liver. IMPRESSION: 1. Gallbladder wall thickening in the absence of cholelithiasis. 2. Hepatic steatosis and hepatomegaly with additional findings suggestive of hepatic cirrhosis. 3. No flow within the main portal vein which may represent sequelae associated with extensive portal hypertension. Correlation with contrast-enhanced  abdomen pelvis CT is recommended, as portal vein thrombosis cannot be excluded. 4. Perihepatic ascites which may represent the source of the previously noted gallbladder wall thickening. Electronically Signed   By: Virgina Norfolk M.D.   On: 06/11/2021 03:15

## 2021-06-15 ENCOUNTER — Other Ambulatory Visit (HOSPITAL_COMMUNITY): Payer: Self-pay

## 2021-06-15 ENCOUNTER — Other Ambulatory Visit: Payer: Self-pay | Admitting: Physician Assistant

## 2021-06-15 DIAGNOSIS — K7011 Alcoholic hepatitis with ascites: Secondary | ICD-10-CM

## 2021-06-15 DIAGNOSIS — R69 Illness, unspecified: Secondary | ICD-10-CM | POA: Diagnosis not present

## 2021-06-15 DIAGNOSIS — K7200 Acute and subacute hepatic failure without coma: Secondary | ICD-10-CM | POA: Diagnosis not present

## 2021-06-15 LAB — CBC WITH DIFFERENTIAL/PLATELET
Abs Immature Granulocytes: 0.5 10*3/uL — ABNORMAL HIGH (ref 0.00–0.07)
Basophils Absolute: 0 10*3/uL (ref 0.0–0.1)
Basophils Relative: 0 %
Eosinophils Absolute: 0 10*3/uL (ref 0.0–0.5)
Eosinophils Relative: 0 %
HCT: 30.5 % — ABNORMAL LOW (ref 39.0–52.0)
Hemoglobin: 10.4 g/dL — ABNORMAL LOW (ref 13.0–17.0)
Lymphocytes Relative: 5 %
Lymphs Abs: 0.9 10*3/uL (ref 0.7–4.0)
MCH: 33.8 pg (ref 26.0–34.0)
MCHC: 34.1 g/dL (ref 30.0–36.0)
MCV: 99 fL (ref 80.0–100.0)
Metamyelocytes Relative: 1 %
Monocytes Absolute: 1.1 10*3/uL — ABNORMAL HIGH (ref 0.1–1.0)
Monocytes Relative: 6 %
Myelocytes: 1 %
Neutro Abs: 15.2 10*3/uL — ABNORMAL HIGH (ref 1.7–7.7)
Neutrophils Relative %: 86 %
Platelets: 154 10*3/uL (ref 150–400)
Promyelocytes Relative: 1 %
RBC: 3.08 MIL/uL — ABNORMAL LOW (ref 4.22–5.81)
RDW: 16.1 % — ABNORMAL HIGH (ref 11.5–15.5)
WBC: 17.7 10*3/uL — ABNORMAL HIGH (ref 4.0–10.5)
nRBC: 0.7 % — ABNORMAL HIGH (ref 0.0–0.2)
nRBC: 1 /100 WBC — ABNORMAL HIGH

## 2021-06-15 LAB — COMPREHENSIVE METABOLIC PANEL
ALT: 75 U/L — ABNORMAL HIGH (ref 0–44)
AST: 120 U/L — ABNORMAL HIGH (ref 15–41)
Albumin: 1.9 g/dL — ABNORMAL LOW (ref 3.5–5.0)
Alkaline Phosphatase: 315 U/L — ABNORMAL HIGH (ref 38–126)
Anion gap: 11 (ref 5–15)
BUN: 8 mg/dL (ref 6–20)
CO2: 25 mmol/L (ref 22–32)
Calcium: 7.2 mg/dL — ABNORMAL LOW (ref 8.9–10.3)
Chloride: 92 mmol/L — ABNORMAL LOW (ref 98–111)
Creatinine, Ser: 0.63 mg/dL (ref 0.61–1.24)
GFR, Estimated: >60 mL/min (ref >60)
Glucose, Bld: 192 mg/dL — ABNORMAL HIGH (ref 70–99)
Potassium: 3.6 mmol/L (ref 3.5–5.1)
Sodium: 128 mmol/L — ABNORMAL LOW (ref 135–145)
Total Bilirubin: 23.6 mg/dL (ref 0.3–1.2)
Total Protein: 5.9 g/dL — ABNORMAL LOW (ref 6.5–8.1)

## 2021-06-15 LAB — GLUCOSE, CAPILLARY: Glucose-Capillary: 109 mg/dL — ABNORMAL HIGH (ref 70–99)

## 2021-06-15 LAB — BRAIN NATRIURETIC PEPTIDE: B Natriuretic Peptide: 270.7 pg/mL — ABNORMAL HIGH (ref 0.0–100.0)

## 2021-06-15 LAB — PROCALCITONIN: Procalcitonin: 0.43 ng/mL

## 2021-06-15 LAB — PROTIME-INR
INR: 1.4 — ABNORMAL HIGH (ref 0.8–1.2)
Prothrombin Time: 16.7 seconds — ABNORMAL HIGH (ref 11.4–15.2)

## 2021-06-15 LAB — MAGNESIUM: Magnesium: 1.7 mg/dL (ref 1.7–2.4)

## 2021-06-15 LAB — C-REACTIVE PROTEIN: CRP: 18.2 mg/dL — ABNORMAL HIGH (ref <1.0)

## 2021-06-15 MED ORDER — CERTAVITE/ANTIOXIDANTS PO TABS
1.0000 | ORAL_TABLET | Freq: Every day | ORAL | 0 refills | Status: DC
  Filled 2021-06-15: qty 30, 30d supply, fill #0

## 2021-06-15 MED ORDER — PREDNISOLONE SODIUM PHOSPHATE 15 MG/5ML PO SOLN
40.0000 mg | Freq: Every day | ORAL | 0 refills | Status: DC
  Filled 2021-06-15: qty 335, 25d supply, fill #0

## 2021-06-15 MED ORDER — CARVEDILOL 3.125 MG PO TABS
3.1250 mg | ORAL_TABLET | Freq: Two times a day (BID) | ORAL | 0 refills | Status: DC
  Filled 2021-06-15: qty 60, 30d supply, fill #0

## 2021-06-15 MED ORDER — FUROSEMIDE 40 MG PO TABS
40.0000 mg | ORAL_TABLET | Freq: Every day | ORAL | 0 refills | Status: DC
  Filled 2021-06-15: qty 30, 30d supply, fill #0

## 2021-06-15 MED ORDER — SPIRONOLACTONE 50 MG PO TABS
50.0000 mg | ORAL_TABLET | Freq: Every day | ORAL | 0 refills | Status: DC
  Filled 2021-06-15: qty 30, 30d supply, fill #0

## 2021-06-15 MED ORDER — FOLIC ACID 1 MG PO TABS
1.0000 mg | ORAL_TABLET | Freq: Every day | ORAL | 0 refills | Status: DC
  Filled 2021-06-15: qty 30, 30d supply, fill #0

## 2021-06-15 MED ORDER — PANTOPRAZOLE SODIUM 40 MG PO TBEC
40.0000 mg | DELAYED_RELEASE_TABLET | Freq: Every day | ORAL | 0 refills | Status: DC
  Filled 2021-06-15: qty 30, 30d supply, fill #0

## 2021-06-15 MED ORDER — THIAMINE HCL 100 MG PO TABS
100.0000 mg | ORAL_TABLET | Freq: Every day | ORAL | 0 refills | Status: DC
  Filled 2021-06-15: qty 30, 30d supply, fill #0

## 2021-06-15 MED ORDER — NICOTINE 14 MG/24HR TD PT24: 14.0000 mg | MEDICATED_PATCH | Freq: Every day | TRANSDERMAL | 0 refills | Status: DC

## 2021-06-15 NOTE — TOC Progression Note (Signed)
Transition of Care [facility]) - Progression Note    Patient Details  Name: Thomas Snyder MRN: [id number] Date of Birth: 01-21-1981  [facility]) CM/SW Contact  Maddyx Vallie Aileen Fass, [place] Work Phone Number: 06/15/2021, 11:03 AM  Clinical Narrative:     CSW intern visited patient at bedside to provide resources for substance use treatment facilities. The patients mother was also in the room, the patient gave verbal permission for [place] to talk while she was there. CSW intern and patient discussed treatment options and the patient stated they have been wanting these resources and would like to receive treatment. The patient is being discharged today and has transportation home.  Expected Discharge Plan: Home/Self Care Barriers to Discharge: Barriers Resolved  Expected Discharge Plan and Services Expected Discharge Plan: Home/Self Care       Living arrangements for the past 2 months: Apartment Expected Discharge Date: 06/15/21                                     Social Determinants of Health (SDOH) Interventions    Readmission Risk Interventions Readmission Risk Prevention Plan 06/15/2021 06/15/2021  Post Dischage Appt Complete Complete  Medication Screening Complete -  Transportation Screening Complete -  Some recent data might be hidden

## 2021-06-15 NOTE — TOC Transition Note (Signed)
Transition of Care [facility]) - CM/SW Discharge Note   Patient Details  Name: Thomas Snyder MRN: [id number] Date of Birth: 08/14/1980  Transition of Care [facility]) CM/SW Contact:  Cyndi Bender, RN Phone Number: 06/15/2021, 10:58 AM   Clinical Narrative:    Patient stable for discharge. PCP apt made and follow up apt made with GI on 06/27/21. Appointments are on the AVS. Patient has transportation home. CSW did the substance abuse counseling.  No other TOC needs.     Final next level of care: Home/Self Care Barriers to Discharge: Barriers Resolved   Patient Goals and CMS Choice Patient states their goals for this hospitalization and ongoing recovery are:: return home      Discharge Placement             home          Discharge Plan and Services    home                                 Social Determinants of Health (SDOH) Interventions     Readmission Risk Interventions Readmission Risk Prevention Plan 06/15/2021 06/15/2021  Post Dischage Appt Complete Complete  Medication Screening Complete -  Transportation Screening Complete -  Some recent data might be hidden

## 2021-06-15 NOTE — Discharge Summary (Signed)
Physician Discharge Summary  Thomas Snyder HFG:[id number] DOB: Oct 20, 1980 DOA: 06/11/2021  PCP: Pcp, No  Admit date: 06/11/2021 Discharge date: 06/15/2021  Admitted From: Home Disposition:  Home   Recommendations for Outpatient Follow-up:  Follow up with PCP in 1-2 weeks Please obtain BMP/CBC in one week Patient to follow with GI as an outpatient Continue counseling about alcohol abstinence  Home Health:NO   Discharge Condition:Stable CODE STATUS:FULL Diet recommendation: low salt NI protein diet  Brief/Interim Summary:  Thomas Snyder is a 41 y.o. male with medical history significant of alcohol dependence presenting with back and flank pain, jaundice.  He reports that he drinks daily, usually 1 beer and about 4-5 shots of bourbon (it takes him about a week to drink a bottle).  He binged a lot of alcohol on the day of [employer] but his last alcoholic drink was on 0/80/2233.  In the ER he was diagnosed with suspected acute alcoholic hepatitis, GI was consulted and he was admitted for further care.   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 -Management per GI, started on prednisolone, to continue for total of 28 days with start date 2/19, to follow with GI as an outpatient, labs has been scheduled to calculate Lille score.  To follow with GI as an outpatient. -We will need EGD likely as an outpatient -He is started on Lasix, Aldactone, Protonix and Coreg during hospital stay and will be discharged on these medications   Alcohol and tobacco abuse.  -  Last drink over a week before admission.  Counseled to quit both, on CIWA  protocol during hospital stay, he will be discharged on thiamine and folic acid.   Alcoholic cirrhosis with underlying thrombocytopenia.  Supportive care.   Low-grade fever and leukocytosis upon admission.  No clear source could be SBP, improving on Rocephin, paracentesis as above could not be done as he had not enough fluid. -Treated with IV Rocephin and Flagyl during hospital stay, no further need of antibiotics on discharge.   Hypokalemia.  Aggressively replaced we will continue to monitor.     Hyponatremia - due to volume overload, improving with diuresis, -5 L during admission, continue with Lasix and Aldactone on discharge.     Prolonged QTc.  Improved on 06/13/2021 down to 507, likely due to hypokalemia, QTc on 06/14/2021 after stable potassium and magnesium levels is 380 ms on telemetry.   Nonspecific gallbladder thickening.  General surgery consulted, gallbladder is nondistended with no gallstones, patient does not clinically have cholecystitis.     Discharge Diagnoses:  Principal Problem:   Acute liver failure Active Problems:   Prolonged QT interval   Hypokalemia   Alcohol dependence (HCC)   Tobacco dependence   Fever    Discharge Instructions  Discharge Instructions     Diet - low sodium heart healthy   Complete by: As directed    Discharge instructions   Complete by: As directed    Follow with Primary MD Pcp  Get CBC, CMP, INR checked  by Primary MD next visit.    Activity: As tolerated with Full fall precautions use walker/cane & assistance as needed   Disposition Home    Diet: low  salt diet  For Heart failure patients - Check your Weight same time everyday, if you gain over 2 pounds, or you develop in leg swelling, experience more shortness of breath or chest pain, call your Primary MD immediately. Follow Cardiac Low Salt Diet and 1.5 lit/day fluid restriction.   On your next visit with your primary care physician please Get Medicines reviewed and  adjusted.   Please request your Prim.MD to go over all Hospital Tests and Procedure/Radiological results at the follow up, please get all Hospital records sent to your Prim MD by signing hospital release before you go home.   If you experience worsening of your admission symptoms, develop shortness of breath, life threatening emergency, suicidal or homicidal thoughts you must seek medical attention immediately by calling 911 or calling your MD immediately  if symptoms less severe.  You Must read complete instructions/literature along with all the possible adverse reactions/side effects for all the Medicines you take and that have been prescribed to you. Take any new Medicines after you have completely understood and accpet all the possible adverse reactions/side effects.   Do not drive, operating heavy machinery, perform activities at heights, swimming or participation in water activities or provide baby sitting services if your were admitted for syncope or siezures until you have seen by Primary MD or a Neurologist and advised to do so again.  Do not drive when taking Pain medications.    Do not take more than prescribed Pain, Sleep and Anxiety Medications  Special Instructions: If you have smoked or chewed Tobacco  in the last 2 yrs please stop smoking, stop any regular Alcohol  and or any Recreational drug use.  Wear Seat belts while driving.   Please note  You were cared for by a hospitalist during your hospital stay. If you have any questions about your discharge medications or the care you received while you were in the hospital after you are discharged, you can call the unit and asked to speak with the hospitalist on call if the hospitalist that took care of you is not available. Once you are discharged, your primary care physician will handle any further medical issues. Please note that NO REFILLS for any discharge medications will be authorized once you are discharged, as it is  imperative that you return to your primary care physician (or establish a relationship with a primary care physician if you do not have one) for your aftercare needs so that they can reassess your need for medications and monitor your lab values.   Increase activity slowly   Complete by: As directed       Allergies as of 06/15/2021       Reactions   Milk-related Compounds    Penicillins    Shellfish Allergy    Other reaction(s): Other (See Comments) Rash         Medication List     STOP taking these medications    acetaminophen 325 MG tablet Commonly known as: TYLENOL   calcium carbonate 500 MG chewable tablet Commonly known as: TUMS - dosed in mg elemental calcium   loratadine 10 MG tablet Commonly known as: CLARITIN   omeprazole 20 MG tablet Commonly known as: PRILOSEC OTC       TAKE these medications    carvedilol 3.125 MG tablet Commonly known as: COREG Take 1 tablet (3.125 mg total) by mouth 2 (two) times daily with a meal.   CertaVite/Antioxidants Tabs Take 1 tablet by mouth daily. Start taking on:  June 16, 2120   folic acid 1 MG tablet Commonly known as: FOLVITE Take 1 tablet (1 mg total) by mouth daily. Start taking on: June 16, 2021   furosemide 40 MG tablet Commonly known as: LASIX Take 1 tablet (40 mg total) by mouth daily.   nicotine 14 mg/24hr patch Commonly known as: NICODERM CQ - dosed in mg/24 hours Place 1 patch (14 mg total) onto the skin daily.   pantoprazole 40 MG tablet Commonly known as: PROTONIX Take 1 tablet (40 mg total) by mouth daily. Start taking on: June 16, 2021   prednisoLONE 15 MG/5ML solution Commonly known as: ORAPRED Take 13.3 mLs (40 mg total) by mouth daily with breakfast for 25 days.   spironolactone 50 MG tablet Commonly known as: ALDACTONE Take 1 tablet (50 mg total) by mouth daily.   thiamine 100 MG tablet Take 1 tablet (100 mg total) by mouth daily. Start taking on: June 16, 2021         Follow-up Information     Ladell Pier, MD Follow up.   Specialty: Internal Medicine Why: TIME : 9:30 AM DATE: July 18, 2021 DOCTORWynetta Emery, Neoma Laming NEW LOCATION: [id number] Contact information: [place] [address] [phone]         Willia Craze, NP Follow up on 06/27/2021.   Specialty: Gastroenterology Why: 2:30 PM for follow up of cirrhosis Contact information: [place] [address] [phone]         [facility] Follow up on 06/20/2021.   Why: Go to lab for blood draw. Contact information: [place] [address]               Allergies  Allergen Reactions   Milk-Related Compounds    Penicillins    Shellfish Allergy     Other reaction(s): Other (See Comments) Rash     Consultations: GI General surgery   Procedures/Studies: CT ABDOMEN PELVIS W CONTRAST  Result Date: 06/11/2021 CLINICAL DATA:  Liver failure. EXAM: CT ABDOMEN AND PELVIS WITH CONTRAST TECHNIQUE: Multidetector CT imaging of the abdomen and pelvis was performed using the standard protocol following bolus administration of intravenous contrast. RADIATION DOSE REDUCTION: This exam was performed according to the departmental dose-optimization program which includes automated exposure control, adjustment of the mA and/or kV according to patient size and/or use of iterative reconstruction technique. CONTRAST:  16mL OMNIPAQUE IOHEXOL 350 MG/ML SOLN COMPARISON:  None. FINDINGS: Lower chest: No acute abnormality. Hepatobiliary: Hepatomegaly with diffuse hepatic steatosis. Atrophic medial segment of left hepatic lobe is favored to represent confluent fibrosis in the setting of cirrhosis. Recanalization of the umbilical vein is identified. Gallbladder appears partially decompressed. Pancreas: Unremarkable. No pancreatic ductal dilatation or surrounding inflammatory changes.  Spleen: Mild splenomegaly measuring 13.1 cm in length. No focal splenic lesion. Adrenals/Urinary Tract: Normal adrenal glands. The kidneys are unremarkable. No nephrolithiasis or hydronephrosis. Urinary bladder appears normal. Stomach/Bowel: Stomach appears within normal limits. The appendix is visualized and is normal. There is mild circumferential bowel wall edema involving the proximal colon. Marked edema is noted involving the mid and distal sigmoid colon and rectum. No pathologic dilatation of the large or small bowel loops. Vascular/Lymphatic: Aortic atherosclerosis. The upper abdominal vascularity is patent including the intra and extrahepatic portal vein. Small esophageal and gastric varices identified. Recanalization of the umbilical vein. No abdominopelvic adenopathy identified. No abdominopelvic adenopathy. Reproductive: Prostate is unremarkable. Other: Small volume of perihepatic, lower  abdominal and pelvic ascites. No discrete fluid collections. Musculoskeletal: No acute or significant osseous findings. IMPRESSION: 1. Morphologic features of the liver compatible with cirrhosis. There is a hepatic steatosis and marked hepatomegaly. Confluent fibrosis of the medial segment of left hepatic lobe noted. 2. Stigmata of portal venous hypertension with recanalized umbilical vein, small esophageal and gastric varices, and small volume of ascites. 3. Mild circumferential bowel wall edema involving the proximal colon with marked edema involving the mid and distal sigmoid colon and rectum. Findings are nonspecific and may reflect underlying colitis versus hepatic colopathy 4. Aortic Atherosclerosis (ICD10-I70.0). Electronically Signed   By: Kerby Moors M.D.   On: 06/11/2021 07:47   US Abdomen Limited  Result Date: 06/12/2021 CLINICAL DATA:  Ascites.  Evaluate for paracentesis. EXAM: LIMITED ABDOMEN ULTRASOUND FOR ASCITES TECHNIQUE: Limited ultrasound survey for ascites was performed in all four abdominal  quadrants. COMPARISON:  CT 06/11/2021 FINDINGS: Trace perihepatic ascites. No significant ascites in the lower quadrants. No significant ascites in the left upper quadrant. IMPRESSION: Trace perihepatic ascites. Paracentesis not performed due to the small amount of fluid. Electronically Signed   By: Markus Daft M.D.   On: 06/12/2021 12:15   US Abdomen Limited  Result Date: 06/11/2021 CLINICAL DATA:  Right upper quadrant pain. EXAM: ULTRASOUND ABDOMEN LIMITED RIGHT UPPER QUADRANT COMPARISON:  None. FINDINGS: Gallbladder: No gallstones are visualized. The gallbladder wall is thickened and measures 4.8 mm in thickness. No sonographic Murphy sign noted by sonographer. Common bile duct: Diameter: 3.1 mm Liver: The liver is enlarged and measures approximately 23.2 cm in length. No focal lesion identified. The liver parenchyma is nodular in contour and diffusely increased in echogenicity. No flow is identified within the portal vein on color Doppler evaluation. Other: A mild amount of ascites is seen adjacent to the liver. IMPRESSION: 1. Gallbladder wall thickening in the absence of cholelithiasis. 2. Hepatic steatosis and hepatomegaly with additional findings suggestive of hepatic cirrhosis. 3. No flow within the main portal vein which may represent sequelae associated with extensive portal hypertension. Correlation with contrast-enhanced abdomen pelvis CT is recommended, as portal vein thrombosis cannot be excluded. 4. Perihepatic ascites which may represent the source of the previously noted gallbladder wall thickening. Electronically Signed   By: Virgina Norfolk M.D.   On: 06/11/2021 03:15      Subjective:  Patient reports he is feeling better today, no nausea, no vomiting, tolerating oral intake. Discharge Exam: Vitals:   06/15/21 0349 06/15/21 0752  BP: (!) 125/95 123/90  Pulse: (!) 104 (!) 109  Resp: 20 18  Temp: 98.4 F (36.9 C) 98.7 F (37.1 C)  SpO2: 93% 96%   Vitals:   06/14/21 1938  06/14/21 2329 06/15/21 0349 06/15/21 0752  BP: (!) 128/97 128/86 (!) 125/95 123/90  Pulse: (!) 108 (!) 102 (!) 104 (!) 109  Resp: 19 18 20 18   Temp: 98.3 F (36.8 C) 98.2 F (36.8 C) 98.4 F (36.9 C) 98.7 F (37.1 C)  TempSrc: Oral Oral Oral Oral  SpO2: 97% 97% 93% 96%  Weight:      Height:        General: Pt is alert, awake, not in acute distress, chronically ill-appearing, jaundiced. Cardiovascular: RRR, S1/S2 +, no rubs, no gallops Respiratory: CTA bilaterally, no wheezing, no rhonchi Abdominal: Significant hepatosplenomegaly, nontender, bowel sounds present  Extremities: no edema, no cyanosis    The results of significant diagnostics from this hospitalization (including imaging, microbiology, ancillary and laboratory) are listed below for reference.  Microbiology: Recent Results (from the past 240 hour(s))  Resp Panel by RT-PCR (Flu A&B, Covid) Nasopharyngeal Swab     Status: None   Collection Time: 06/11/21  3:15 AM   Specimen: Nasopharyngeal Swab; Nasopharyngeal(NP) swabs in vial transport medium  Result Value Ref Range Status   SARS Coronavirus 2 by RT PCR NEGATIVE NEGATIVE Final    Comment: (NOTE) SARS-CoV-2 target nucleic acids are NOT DETECTED.  The SARS-CoV-2 RNA is generally detectable in upper respiratory specimens during the acute phase of infection. The lowest concentration of SARS-CoV-2 viral copies this assay can detect is 138 copies/mL. A negative result does not preclude SARS-Cov-2 infection and should not be used as the sole basis for treatment or other patient management decisions. A negative result may occur with  improper specimen collection/handling, submission of specimen other than nasopharyngeal swab, presence of viral mutation(s) within the areas targeted by this assay, and inadequate number of viral copies(<138 copies/mL). A negative result must be combined with clinical observations, patient history, and epidemiological information. The  expected result is Negative.  Fact Sheet for Patients:  EntrepreneurPulse.com.au  Fact Sheet for Healthcare Providers:  IncredibleEmployment.be  This test is no t yet approved or cleared by the [place] FDA and  has been authorized for detection and/or diagnosis of SARS-CoV-2 by FDA under an Emergency Use Authorization (EUA). This EUA will remain  in effect (meaning this test can be used) for the duration of the COVID-19 declaration under Section 564(b)(1) of the Act, 21 U.S.C.section 360bbb-3(b)(1), unless the authorization is terminated  or revoked sooner.       Influenza A by PCR NEGATIVE NEGATIVE Final   Influenza B by PCR NEGATIVE NEGATIVE Final    Comment: (NOTE) The Xpert Xpress SARS-CoV-2/FLU/RSV plus assay is intended as an aid in the diagnosis of influenza from Nasopharyngeal swab specimens and should not be used as a sole basis for treatment. Nasal washings and aspirates are unacceptable for Xpert Xpress SARS-CoV-2/FLU/RSV testing.  Fact Sheet for Patients: EntrepreneurPulse.com.au  Fact Sheet for Healthcare Providers: IncredibleEmployment.be  This test is not yet approved or cleared by the [place] FDA and has been authorized for detection and/or diagnosis of SARS-CoV-2 by FDA under an Emergency Use Authorization (EUA). This EUA will remain in effect (meaning this test can be used) for the duration of the COVID-19 declaration under Section 564(b)(1) of the Act, 21 U.S.C. section 360bbb-3(b)(1), unless the authorization is terminated or revoked.  Performed at [facility], [place] [address] [place], [place] [address]   Blood culture (routine x 2)     Status: None (Preliminary result)   Collection Time: 06/11/21  8:20 AM   Specimen: BLOOD LEFT ARM  Result Value Ref Range Status   Specimen Description BLOOD LEFT ARM  Final   Special Requests   Final    BOTTLES DRAWN AEROBIC  AND ANAEROBIC Blood Culture adequate volume   Culture   Final    NO GROWTH 4 DAYS Performed at [facility], [place] [address] [place],  [address]    Report Status PENDING  Incomplete  Blood culture (routine x 2)     Status: None (Preliminary result)   Collection Time: 06/11/21  8:50 AM   Specimen: BLOOD LEFT HAND  Result Value Ref Range Status   Specimen Description BLOOD LEFT HAND  Final   Special Requests   Final    BOTTLES DRAWN AEROBIC AND ANAEROBIC Blood Culture adequate volume   Culture   Final  NO GROWTH 4 DAYS Performed at [facility], [place] [address], [place], [place] [address]    Report Status PENDING  Incomplete     Labs: BNP (last 3 results) Recent Labs    06/13/21 0115 06/14/21 0042 06/15/21 0219  BNP 187.1* 169.1* 643.3*   Basic Metabolic Panel: Recent Labs  Lab 06/11/21 0454 06/11/21 1738 06/12/21 0118 06/12/21 1154 06/12/21 1922 06/13/21 0115 06/14/21 0042 06/15/21 0219  NA  --  127* 127*  --   --  128* 129* 128*  K  --  2.4* 2.2* 3.0* 3.9 3.4* 3.4* 3.6  CL  --  80* 81*  --   --  87* 90* 92*  CO2  --  34* 34*  --   --  29 29 25   GLUCOSE  --  120* 116*  --   --  161* 188* 192*  BUN  --  5* 6  --   --  7 8 8   CREATININE  --  0.71 0.72  --   --  0.59* 0.73 0.63  CALCIUM  --  7.7* 7.6*  --   --  7.8* 7.6* 7.2*  MG 1.2*  --  1.7  --   --  2.1 1.8 1.7   Liver Function Tests: Recent Labs  Lab 06/11/21 0249 06/12/21 0118 06/13/21 0115 06/14/21 0042 06/15/21 0219  AST 196* 163* 182* 174* 120*  ALT 74* 56* 67* 73* 75*  ALKPHOS 398* 316* 335* 335* 315*  BILITOT 24.5* 23.7* 24.6* 24.6* 23.6*  PROT 6.8 5.8* 6.1* 6.1* 5.9*  ALBUMIN 2.2* 1.8* 1.9* 1.9* 1.9*   Recent Labs  Lab 06/11/21 0249  LIPASE 41   No results for input(s): AMMONIA in the last 168 hours. CBC: Recent Labs  Lab 06/11/21 0249 06/12/21 0118 06/13/21 0115 06/14/21 0042 06/15/21 0219  WBC 18.2* 12.9* 13.1* 17.6* 17.7*  NEUTROABS 15.7* 10.4* 10.9* 13.8*  15.2*  HGB 11.5* 10.0* 10.3* 10.4* 10.4*  HCT 31.5* 27.9* 29.2* 29.7* 30.5*  MCV 96.3 96.9 98.0 98.7 99.0  PLT 110* 88* 106* 142* 154   Cardiac Enzymes: No results for input(s): CKTOTAL, CKMB, CKMBINDEX, TROPONINI in the last 168 hours. BNP: Invalid input(s): POCBNP CBG: Recent Labs  Lab 06/12/21 0913 06/14/21 1937 06/15/21 0453  GLUCAP 192* 168* 109*   D-Dimer No results for input(s): DDIMER in the last 72 hours. Hgb A1c No results for input(s): HGBA1C in the last 72 hours. Lipid Profile No results for input(s): CHOL, HDL, LDLCALC, TRIG, CHOLHDL, LDLDIRECT in the last 72 hours. Thyroid function studies No results for input(s): TSH, T4TOTAL, T3FREE, THYROIDAB in the last 72 hours.  Invalid input(s): FREET3 Anemia work up No results for input(s): VITAMINB12, FOLATE, FERRITIN, TIBC, IRON, RETICCTPCT in the last 72 hours. Urinalysis    Component Value Date/Time   COLORURINE AMBER (A) 06/11/2021 0226   APPEARANCEUR CLOUDY (A) 06/11/2021 0226   LABSPEC 1.021 06/11/2021 0226   PHURINE 5.0 06/11/2021 0226   GLUCOSEU 50 (A) 06/11/2021 0226   HGBUR SMALL (A) 06/11/2021 0226   BILIRUBINUR MODERATE (A) 06/11/2021 0226   KETONESUR NEGATIVE 06/11/2021 0226   PROTEINUR 30 (A) 06/11/2021 0226   NITRITE NEGATIVE 06/11/2021 0226   LEUKOCYTESUR NEGATIVE 06/11/2021 0226   Sepsis Labs Invalid input(s): PROCALCITONIN,  WBC,  LACTICIDVEN Microbiology Recent Results (from the past 240 hour(s))  Resp Panel by RT-PCR (Flu A&B, Covid) Nasopharyngeal Swab     Status: None   Collection Time: 06/11/21  3:15 AM   Specimen: Nasopharyngeal  Swab; Nasopharyngeal(NP) swabs in vial transport medium  Result Value Ref Range Status   SARS Coronavirus 2 by RT PCR NEGATIVE NEGATIVE Final    Comment: (NOTE) SARS-CoV-2 target nucleic acids are NOT DETECTED.  The SARS-CoV-2 RNA is generally detectable in upper respiratory specimens during the acute phase of infection. The lowest concentration of  SARS-CoV-2 viral copies this assay can detect is 138 copies/mL. A negative result does not preclude SARS-Cov-2 infection and should not be used as the sole basis for treatment or other patient management decisions. A negative result may occur with  improper specimen collection/handling, submission of specimen other than nasopharyngeal swab, presence of viral mutation(s) within the areas targeted by this assay, and inadequate number of viral copies(<138 copies/mL). A negative result must be combined with clinical observations, patient history, and epidemiological information. The expected result is Negative.  Fact Sheet for Patients:  EntrepreneurPulse.com.au  Fact Sheet for Healthcare Providers:  IncredibleEmployment.be  This test is no t yet approved or cleared by the [place] FDA and  has been authorized for detection and/or diagnosis of SARS-CoV-2 by FDA under an Emergency Use Authorization (EUA). This EUA will remain  in effect (meaning this test can be used) for the duration of the COVID-19 declaration under Section 564(b)(1) of the Act, 21 U.S.C.section 360bbb-3(b)(1), unless the authorization is terminated  or revoked sooner.       Influenza A by PCR NEGATIVE NEGATIVE Final   Influenza B by PCR NEGATIVE NEGATIVE Final    Comment: (NOTE) The Xpert Xpress SARS-CoV-2/FLU/RSV plus assay is intended as an aid in the diagnosis of influenza from Nasopharyngeal swab specimens and should not be used as a sole basis for treatment. Nasal washings and aspirates are unacceptable for Xpert Xpress SARS-CoV-2/FLU/RSV testing.  Fact Sheet for Patients: EntrepreneurPulse.com.au  Fact Sheet for Healthcare Providers: IncredibleEmployment.be  This test is not yet approved or cleared by the [place] FDA and has been authorized for detection and/or diagnosis of SARS-CoV-2 by FDA under an Emergency Use  Authorization (EUA). This EUA will remain in effect (meaning this test can be used) for the duration of the COVID-19 declaration under Section 564(b)(1) of the Act, 21 U.S.C. section 360bbb-3(b)(1), unless the authorization is terminated or revoked.  Performed at [facility], [place] [address] [place], [place] [address]   Blood culture (routine x 2)     Status: None (Preliminary result)   Collection Time: 06/11/21  8:20 AM   Specimen: BLOOD LEFT ARM  Result Value Ref Range Status   Specimen Description BLOOD LEFT ARM  Final   Special Requests   Final    BOTTLES DRAWN AEROBIC AND ANAEROBIC Blood Culture adequate volume   Culture   Final    NO GROWTH 4 DAYS Performed at [facility], [place] [address] [place], [place] [address]    Report Status PENDING  Incomplete  Blood culture (routine x 2)     Status: None (Preliminary result)   Collection Time: 06/11/21  8:50 AM   Specimen: BLOOD LEFT HAND  Result Value Ref Range Status   Specimen Description BLOOD LEFT HAND  Final   Special Requests   Final    BOTTLES DRAWN AEROBIC AND ANAEROBIC Blood Culture adequate volume   Culture   Final    NO GROWTH 4 DAYS Performed at [facility], [place] [address] [place], [place] [address]    Report Status PENDING  Incomplete     Time coordinating discharge: Over 10  minutes  SIGNED:   Phillips Climes, MD  [facility] 06/15/2021, 3:44 PM Pager   If 7PM-7AM, please contact night-coverage [email or web address] Password [email or web address]

## 2021-06-16 LAB — CULTURE, BLOOD (ROUTINE X 2)
Culture: NO GROWTH
Culture: NO GROWTH
Special Requests: ADEQUATE
Special Requests: ADEQUATE

## 2021-06-20 ENCOUNTER — Other Ambulatory Visit (INDEPENDENT_AMBULATORY_CARE_PROVIDER_SITE_OTHER): Payer: 59

## 2021-06-20 DIAGNOSIS — R69 Illness, unspecified: Secondary | ICD-10-CM | POA: Diagnosis not present

## 2021-06-20 DIAGNOSIS — K7011 Alcoholic hepatitis with ascites: Secondary | ICD-10-CM | POA: Diagnosis not present

## 2021-06-20 LAB — COMPREHENSIVE METABOLIC PANEL
ALT: 90 U/L — ABNORMAL HIGH (ref 0–53)
AST: 145 U/L — ABNORMAL HIGH (ref 0–37)
Albumin: 3.1 g/dL — ABNORMAL LOW (ref 3.5–5.2)
Alkaline Phosphatase: 335 U/L — ABNORMAL HIGH (ref 39–117)
BUN: 9 mg/dL (ref 6–23)
CO2: 24 mEq/L (ref 19–32)
Calcium: 7 mg/dL — ABNORMAL LOW (ref 8.4–10.5)
Chloride: 95 mEq/L — ABNORMAL LOW (ref 96–112)
Creatinine, Ser: 0.62 mg/dL (ref 0.40–1.50)
GFR: 121.32 mL/min (ref >60.00)
Glucose, Bld: 240 mg/dL — ABNORMAL HIGH (ref 70–99)
Potassium: 3.4 mEq/L — ABNORMAL LOW (ref 3.5–5.1)
Sodium: 128 mEq/L — ABNORMAL LOW (ref 135–145)
Total Bilirubin: 22.4 mg/dL — ABNORMAL HIGH (ref 0.2–1.2)
Total Protein: 6.3 g/dL (ref 6.0–8.3)

## 2021-06-20 LAB — PROTIME-INR
INR: 1.4 ratio — ABNORMAL HIGH (ref 0.8–1.0)
Prothrombin Time: 15.3 s — ABNORMAL HIGH (ref 9.6–13.1)

## 2021-06-20 LAB — HEMOCHROMATOSIS DNA-PCR(C282Y,H63D)

## 2021-06-24 ENCOUNTER — Encounter: Payer: Self-pay | Admitting: Nurse Practitioner

## 2021-06-27 ENCOUNTER — Other Ambulatory Visit (INDEPENDENT_AMBULATORY_CARE_PROVIDER_SITE_OTHER): Payer: 59

## 2021-06-27 ENCOUNTER — Ambulatory Visit: Payer: 59 | Admitting: Nurse Practitioner

## 2021-06-27 ENCOUNTER — Encounter: Payer: Self-pay | Admitting: Nurse Practitioner

## 2021-06-27 VITALS — BP 102/68 | HR 110 | Ht 65.0 in | Wt 153.1 lb

## 2021-06-27 DIAGNOSIS — K7031 Alcoholic cirrhosis of liver with ascites: Secondary | ICD-10-CM | POA: Diagnosis not present

## 2021-06-27 DIAGNOSIS — R69 Illness, unspecified: Secondary | ICD-10-CM | POA: Diagnosis not present

## 2021-06-27 DIAGNOSIS — K7011 Alcoholic hepatitis with ascites: Secondary | ICD-10-CM

## 2021-06-27 DIAGNOSIS — R933 Abnormal findings on diagnostic imaging of other parts of digestive tract: Secondary | ICD-10-CM | POA: Diagnosis not present

## 2021-06-27 DIAGNOSIS — K219 Gastro-esophageal reflux disease without esophagitis: Secondary | ICD-10-CM | POA: Diagnosis not present

## 2021-06-27 DIAGNOSIS — Z23 Encounter for immunization: Secondary | ICD-10-CM

## 2021-06-27 LAB — CBC
HCT: 34.2 % — ABNORMAL LOW (ref 39.0–52.0)
Hemoglobin: 11.5 g/dL — ABNORMAL LOW (ref 13.0–17.0)
MCHC: 33.6 g/dL (ref 30.0–36.0)
MCV: 101.4 fl — ABNORMAL HIGH (ref 78.0–100.0)
Platelets: 194 10*3/uL (ref 150.0–400.0)
RBC: 3.37 Mil/uL — ABNORMAL LOW (ref 4.22–5.81)
RDW: 15.9 % — ABNORMAL HIGH (ref 11.5–15.5)
WBC: 22.2 10*3/uL (ref 4.0–10.5)

## 2021-06-27 LAB — PROTIME-INR
INR: 1.7 ratio — ABNORMAL HIGH (ref 0.8–1.0)
Prothrombin Time: 17.8 s — ABNORMAL HIGH (ref 9.6–13.1)

## 2021-06-27 LAB — COMPREHENSIVE METABOLIC PANEL
ALT: 124 U/L — ABNORMAL HIGH (ref 0–53)
AST: 162 U/L — ABNORMAL HIGH (ref 0–37)
Albumin: 3.3 g/dL — ABNORMAL LOW (ref 3.5–5.2)
Alkaline Phosphatase: 374 U/L — ABNORMAL HIGH (ref 39–117)
BUN: 9 mg/dL (ref 6–23)
CO2: 24 mEq/L (ref 19–32)
Calcium: 9.1 mg/dL (ref 8.4–10.5)
Chloride: 92 mEq/L — ABNORMAL LOW (ref 96–112)
Creatinine, Ser: 0.57 mg/dL (ref 0.40–1.50)
GFR: 124.42 mL/min (ref >60.00)
Glucose, Bld: 203 mg/dL — ABNORMAL HIGH (ref 70–99)
Potassium: 3.9 mEq/L (ref 3.5–5.1)
Sodium: 127 mEq/L — ABNORMAL LOW (ref 135–145)
Total Bilirubin: 20.4 mg/dL — ABNORMAL HIGH (ref 0.2–1.2)
Total Protein: 6.7 g/dL (ref 6.0–8.3)

## 2021-06-27 MED ORDER — SUTAB 1479-225-188 MG PO TABS: 24.0000 | ORAL_TABLET | ORAL | 0 refills | Status: DC

## 2021-06-27 MED ORDER — PANTOPRAZOLE SODIUM 40 MG PO TBEC: 40.0000 mg | DELAYED_RELEASE_TABLET | Freq: Every morning | ORAL | 3 refills | Status: DC

## 2021-06-27 NOTE — Patient Instructions (Addendum)
We have sent the following medications to your pharmacy for you to pick up at your convenience: ?Sutab ? ?Resume Pantoprazole '40mg'$ - Take 1 tablet by mouth 30 mins before breakfast daily.  ? ?Your next Hep B (heplisav) injection will be on : 08/01/21 at 3:30pm ? ?The [place] [facility] providers would like to encourage you to use [facility] to communicate with providers for non-urgent requests or questions.  Due to long hold times on the telephone, sending your provider a message by [facility] may be a faster and more efficient way to get a response.  Please allow 48 business hours for a response.  Please remember that this is for non-urgent requests.  ?______________________________________________________ ? ? ?If you are age 55 or younger, your body mass index should be between 19-25. Your Body mass index is 25.48 kg/m?Marland Kitchen If this is out of the aformentioned range listed, please consider follow up with your Primary Care Provider.  ?__________________________________________________ ? ?The [place] [facility] providers would like to encourage you to use [facility] to communicate with providers for non-urgent requests or questions.  Due to long hold times on the telephone, sending your provider a message by [facility] may be a faster and more efficient way to get a response.  Please allow 48 business hours for a response.  Please remember that this is for non-urgent requests.  ?__________________________________________________ ? ?Your provider has requested that you go to the basement level for lab work before leaving today. Press "B" on the elevator. The lab is located at the first door on the left as you exit the elevator. ? ?You have been scheduled for an endoscopy and colonoscopy. Please follow the written instructions given to you at your visit today. ?Please pick up your prep supplies at the pharmacy within the next 1-3 days. ?If you use inhalers (even only as needed), please bring them with you on the day of your procedure. ? ?Due to recent  changes in healthcare laws, you may see the results of your imaging and laboratory studies on MyChart before your provider has had a chance to review them.  We understand that in some cases there may be results that are confusing or concerning to you. Not all laboratory results come back in the same time frame and the provider may be waiting for multiple results in order to interpret others.  Please give [place] 48 hours in order for your provider to thoroughly review all the results before contacting the office for clarification of your results.  ? ? ?CIRRHOSIS ? ?Swelling / fluid buildup ?Today we may have discussed how to help with swelling in you legs and / or fluid buildup in your abdomen (called ascites). If so then it is important to follow the recommendations below.  ? ? Limit intake of sodium (salt) to 2,000 mg ( 2 grams) a day.  ?Avoid adding salt to food. Know that salt is added to many prepared or packaged foods so try to buy fresh food , Lacinda Axon food from scratch instead of eating fast foods, frozen foods and canned foods.  If you do use canned foods including vegetables, beans, meats or fish then wash with water before eating.  Look for food / labels with words like "sodium free ", "salt free", "reduced salt" or "unsalted". Be aware that the milligrams of sodium ( salt )  listed on packages is often PER serving so keep in mind how many servings you eat.  ? ?2.  Check your weight daily  ?Check you weight every  morning at the same time before you get dressed . Use the same scale to weight yourself. Call our office for significant changes in your weight ( greater than 5 pounds) ? ?3. Take diuretics ( fluid pills) exactly as prescribed ? ? ?Management of mild pain at home ?If you have pain it is okay to take up to 2,000 mg of Acetaminophen ( Tylenol) in a 24 hour perod. However, please be sure that you are not already getting Acetaminophen in any of your other medications at this could result in excessive intake.  Please  avoid medications such as Motrin, Aleve, Ibuprofen, Goody powders or BC powders as they can result in kidney injury, especially in patients with liver disease  ? ? ?Thank you for choosing me and [facility]. ? ? ? ? ? ?

## 2021-06-27 NOTE — Progress Notes (Signed)
? ? ? ?ASSESSMENT AND PLAN   ? ? ?# 41 yo male with recently hospitalized with etoh hepatitis and newly diagnosed cirrhosis with portal hypertension. MELD 27. Cirrhosis likely Etoh related. Hepatic serologic workup negative.  ?--Stop Prednisolone. It was started in hospital then discontinued based on outpatient follow up labs  / Lille score. Due to misunderstanding on phone patient stopped Pantoprazole instead.  ?--Abstinent from Etoh since hospital discharge ?--Insufficient ascites for diagnostic paracentesis in hospital but he was having lower extremity swelling that started in the hospital. No edema today, doesn't appear to have significant ascites. Continue lasix 40 mg. Continue aldactone 50 mg ( though this is an unusual regimen). Discussed 2 gram sodium restricted diet. Will check renal function and electrolytes today. ?--Needs HAV , HBV vaccines. Will offer to start today.  ?--CT scan suggests varices. Needs EGD for further evaluation. The risks and benefits of EGD with possible biopsies were discussed with the patient who agrees to proceed. I scheduled procedure at [facility]. If Dr. Havery Moros anticipates banding then can change venue to hospital.  ?--Most recent labs done 2/27. INR 1.4. Repeat LFTs, INR, CBC today ?--Hemochromatosis genetic testing negative.  ?  ? ?# Bowel wall thickening of proximal sigmoid colon and rectum on CT AP. Findings felt to be secondary to colitis vrs hepatic colopathy. Not having diarrhea now but was having loose stool around time he was admitted. No stool studies were done. Stools formed now. No blood in stool but endorses minor perianal bleeding with bowel movements sometimes.  ?--Will arrange for colonoscopy to be done at time of colonoscopy to evaluate CT scan findings and occasional minor bleeding.  ? ?# GERD, chronic. Having a lot of heartburn due to being off PPI  ?--Resume daily PPI ? ? ?Patient Profile:  ? ?Thomas Snyder is a 41 y.o. male with a past medical history of  Etoh abuse, cirrhosis. DM Additional medical history as listed in PMH .  ? ?Brief history:  ?06/11/21 hospitalized with nausea, vomiting, right flank pain ,Etoh hepatitis, elevated WBC. CTAP showed changes of cirrhosis with portal HTN and suspected varices. Mild ascites. Slightly thickened gallbladder wall. Evaluated by General Surgery who didn't feel he needed any further gallbladder evaluation. Not enough ascites for diagnostic tap. DF met criteria for steroids and Prednisolone was started. At some point he developed lower extremity edema, started on 2 grams sodium diet and diuretics. Discharged home 2/22. Got follow up labs here on 2/27.  Lille score showed prednisolone wasn't working and so we advised him to discontinue it. He got confused, stopped the pantoprazole instead.   ? ?Interval History  ?Having a lot of heartburn. Swelling in legs has resolved. He is trying to follow a low salt diet.  ?No Etoh use since leaving hospital. Bowel movements are normal now but he was having loose stools for a few months prior to hospital admission.  Occasionally sees small amount of blood when wiping but thinks it is perianal irrigation.  ? ? ?LABORATORY DATA ? ?Hepatic Function Latest Ref Rng & Units 06/20/2021 06/15/2021 06/14/2021  ?Total Protein 6.0 - 8.3 g/dL 6.3 5.9(L) 6.1(L)  ?Albumin 3.5 - 5.2 g/dL 3.1(L) 1.9(L) 1.9(L)  ?AST 0 - 37 U/L 145(H) 120(H) 174(H)  ?ALT 0 - 53 U/L 90(H) 75(H) 73(H)  ?Alk Phosphatase 39 - 117 U/L 335(H) 315(H) 335(H)  ?Total Bilirubin 0.2 - 1.2 mg/dL 22.4(H) 23.6(HH) 24.6(HH)  ? ? ?CBC Latest Ref Rng & Units 06/15/2021 06/14/2021 06/13/2021  ?WBC 4.0 - 10.5  K/uL 17.7(H) 17.6(H) 13.1(H)  ?Hemoglobin 13.0 - 17.0 g/dL 10.4(L) 10.4(L) 10.3(L)  ?Hematocrit 39.0 - 52.0 % 30.5(L) 29.7(L) 29.2(L)  ?Platelets 150 - 400 K/uL 154 142(L) 106(L)  ? ? ? ?PREVIOUS GI EVALUATIONS:  ? ?Endoscopies:  ? ?none ? ? ?Imaging:  ?IMPRESSION: ?1. Morphologic features of the liver compatible with cirrhosis. There is a  hepatic steatosis and marked hepatomegaly. Confluent fibrosis of the medial segment of left hepatic lobe noted. ?2. Stigmata of portal venous hypertension with recanalized umbilical ?vein, small esophageal and gastric varices, and small volume of ascites. ?3. Mild circumferential bowel wall edema involving the proximal colon with marked edema involving the mid and distal sigmoid colon ?and rectum. Findings are nonspecific and may reflect underlying colitis versus hepatic colopathy ?4. Aortic Atherosclerosis (ICD10-I70.0). ?  ? ? ? ? ?Past Medical History:  ?Diagnosis Date  ? Alcohol dependence ([place])   ? Tobacco dependence   ? ? ?Past Surgical History:  ?Procedure Laterality Date  ? NO PAST SURGERIES    ? ? ? ? ?Current Medications, Allergies, Family History and Social History were reviewed in [employer] record. ?  ?  ?Current Outpatient Medications  ?Medication Sig Dispense Refill  ? carvedilol (COREG) 3.125 MG tablet Take 1 tablet (3.125 mg total) by mouth 2 (two) times daily with a meal. 60 tablet 0  ? folic acid (FOLVITE) 1 MG tablet Take 1 tablet (1 mg total) by mouth daily. 30 tablet 0  ? furosemide (LASIX) 40 MG tablet Take 1 tablet (40 mg total) by mouth daily. 30 tablet 0  ? Magnesium Hydroxide (DULCOLAX SOFT CHEWS PO) Take 1 tablet by mouth as needed.    ? Multiple Vitamin (MULTIVITAMIN) tablet Take 1 tablet by mouth daily.    ? Multiple Vitamins-Minerals (CERTAVITE/ANTIOXIDANTS) TABS Take 1 tablet by mouth daily. 30 tablet 0  ? nicotine (NICODERM CQ - DOSED IN MG/24 HOURS) 14 mg/24hr patch Place 1 patch (14 mg total) onto the skin daily. 28 patch 0  ? prednisoLONE (ORAPRED) 15 MG/5ML solution Take 13.3 mLs (40 mg total) by mouth daily with breakfast for 25 days. 335 mL 0  ? spironolactone (ALDACTONE) 50 MG tablet Take 1 tablet (50 mg total) by mouth daily. 30 tablet 0  ? thiamine 100 MG tablet Take 1 tablet (100 mg total) by mouth daily. 30 tablet 0  ? ?No current  facility-administered medications for this visit.  ? ? ?Review of Systems: ?No chest pain. No shortness of breath. No urinary complaints.  ? ?PHYSICAL EXAM :   ? ?Wt Readings from Last 3 Encounters:  ?06/27/21 153 lb 2 oz (69.5 kg)  ?06/11/21 170 lb (77.1 kg)  ? ? ?BP 102/68   Pulse (!) 110   Ht 5' 5"  (1.651 m)   Wt 153 lb 2 oz (69.5 kg)   BMI 25.48 kg/m?  ?Constitutional:  Generally well appearing male in no acute distress. ?Psychiatric: Pleasant. Normal mood and affect. Behavior is normal. ?EENT: Pupils normal.  Conjunctivae are normal. No scleral icterus. ?Neck supple.  ?Cardiovascular: Normal rate, regular rhythm. No edema ?Pulmonary/chest: Effort normal and breath sounds normal. No wheezing, rales or rhonchi. ?Abdominal: Soft, nondistended, nontender. Bowel sounds active throughout. There are no masses palpable. No hepatomegaly. ?Neurological: Alert and oriented to person place and time. ?Skin: Skin is warm and dry. Jaundiced ? ?Tye Savoy, NP  06/27/2021, 2:53 PM ? ? ? ? ? ? ? ? ? ?

## 2021-06-28 ENCOUNTER — Other Ambulatory Visit: Payer: Self-pay

## 2021-06-28 DIAGNOSIS — K7031 Alcoholic cirrhosis of liver with ascites: Secondary | ICD-10-CM

## 2021-06-28 MED ORDER — FUROSEMIDE 40 MG PO TABS: 20.0000 mg | ORAL_TABLET | Freq: Every day | ORAL | 0 refills | Status: DC

## 2021-06-28 NOTE — Progress Notes (Signed)
Agree with assessment and plan as outlined.  

## 2021-07-05 ENCOUNTER — Other Ambulatory Visit (INDEPENDENT_AMBULATORY_CARE_PROVIDER_SITE_OTHER): Payer: 59

## 2021-07-05 DIAGNOSIS — K7031 Alcoholic cirrhosis of liver with ascites: Secondary | ICD-10-CM

## 2021-07-05 DIAGNOSIS — R69 Illness, unspecified: Secondary | ICD-10-CM | POA: Diagnosis not present

## 2021-07-05 LAB — CBC WITH DIFFERENTIAL/PLATELET
HCT: 30.6 % — ABNORMAL LOW (ref 39.0–52.0)
Hemoglobin: 10.4 g/dL — ABNORMAL LOW (ref 13.0–17.0)
MCHC: 34.1 g/dL (ref 30.0–36.0)
MCV: 100.9 fl — ABNORMAL HIGH (ref 78.0–100.0)
Platelets: 197 10*3/uL (ref 150.0–400.0)
RBC: 3.04 Mil/uL — ABNORMAL LOW (ref 4.22–5.81)
RDW: 16.1 % — ABNORMAL HIGH (ref 11.5–15.5)
WBC: 16.7 10*3/uL — ABNORMAL HIGH (ref 4.0–10.5)

## 2021-07-05 LAB — COMPREHENSIVE METABOLIC PANEL
ALT: 55 U/L — ABNORMAL HIGH (ref 0–53)
AST: 91 U/L — ABNORMAL HIGH (ref 0–37)
Albumin: 2.8 g/dL — ABNORMAL LOW (ref 3.5–5.2)
Alkaline Phosphatase: 421 U/L — ABNORMAL HIGH (ref 39–117)
BUN: 7 mg/dL (ref 6–23)
CO2: 24 mEq/L (ref 19–32)
Calcium: 8.6 mg/dL (ref 8.4–10.5)
Chloride: 95 mEq/L — ABNORMAL LOW (ref 96–112)
Creatinine, Ser: 0.59 mg/dL (ref 0.40–1.50)
GFR: 123.12 mL/min (ref >60.00)
Glucose, Bld: 138 mg/dL — ABNORMAL HIGH (ref 70–99)
Potassium: 3.9 mEq/L (ref 3.5–5.1)
Sodium: 127 mEq/L — ABNORMAL LOW (ref 135–145)
Total Bilirubin: 15.6 mg/dL — ABNORMAL HIGH (ref 0.2–1.2)
Total Protein: 5.7 g/dL — ABNORMAL LOW (ref 6.0–8.3)

## 2021-07-05 LAB — PROTIME-INR
INR: 1.6 ratio — ABNORMAL HIGH (ref 0.8–1.0)
Prothrombin Time: 16.9 s — ABNORMAL HIGH (ref 9.6–13.1)

## 2021-07-14 ENCOUNTER — Other Ambulatory Visit (HOSPITAL_COMMUNITY): Payer: Self-pay

## 2021-07-18 ENCOUNTER — Ambulatory Visit: Payer: 59 | Attending: Internal Medicine | Admitting: Internal Medicine

## 2021-07-18 ENCOUNTER — Encounter: Payer: Self-pay | Admitting: Internal Medicine

## 2021-07-18 ENCOUNTER — Other Ambulatory Visit (HOSPITAL_COMMUNITY): Payer: Self-pay

## 2021-07-18 ENCOUNTER — Other Ambulatory Visit: Payer: Self-pay

## 2021-07-18 VITALS — BP 122/87 | HR 101 | Resp 16 | Ht 65.0 in | Wt 171.8 lb

## 2021-07-18 DIAGNOSIS — N5089 Other specified disorders of the male genital organs: Secondary | ICD-10-CM | POA: Diagnosis not present

## 2021-07-18 DIAGNOSIS — F17210 Nicotine dependence, cigarettes, uncomplicated: Secondary | ICD-10-CM

## 2021-07-18 DIAGNOSIS — R739 Hyperglycemia, unspecified: Secondary | ICD-10-CM

## 2021-07-18 DIAGNOSIS — K7031 Alcoholic cirrhosis of liver with ascites: Secondary | ICD-10-CM | POA: Diagnosis not present

## 2021-07-18 DIAGNOSIS — D539 Nutritional anemia, unspecified: Secondary | ICD-10-CM

## 2021-07-18 DIAGNOSIS — F1021 Alcohol dependence, in remission: Secondary | ICD-10-CM

## 2021-07-18 DIAGNOSIS — F17200 Nicotine dependence, unspecified, uncomplicated: Secondary | ICD-10-CM

## 2021-07-18 DIAGNOSIS — Z7689 Persons encountering health services in other specified circumstances: Secondary | ICD-10-CM

## 2021-07-18 DIAGNOSIS — Z23 Encounter for immunization: Secondary | ICD-10-CM | POA: Diagnosis not present

## 2021-07-18 DIAGNOSIS — R69 Illness, unspecified: Secondary | ICD-10-CM | POA: Diagnosis not present

## 2021-07-18 MED ORDER — CARVEDILOL 3.125 MG PO TABS: 3.1250 mg | ORAL_TABLET | Freq: Two times a day (BID) | ORAL | 6 refills | Status: DC

## 2021-07-18 MED ORDER — FOLIC ACID 1 MG PO TABS: 1.0000 mg | ORAL_TABLET | Freq: Every day | ORAL | 1 refills | Status: AC

## 2021-07-18 MED ORDER — SPIRONOLACTONE 100 MG PO TABS: 100.0000 mg | ORAL_TABLET | Freq: Every day | ORAL | 4 refills | Status: DC

## 2021-07-18 MED ORDER — FUROSEMIDE 40 MG PO TABS: 40.0000 mg | ORAL_TABLET | Freq: Every day | ORAL | 6 refills | Status: DC

## 2021-07-18 MED ORDER — THIAMINE HCL 100 MG PO TABS: 100.0000 mg | ORAL_TABLET | Freq: Every day | ORAL | 3 refills | Status: DC

## 2021-07-18 MED ORDER — SUTAB 1479-225-188 MG PO TABS
24.0000 | ORAL_TABLET | ORAL | 0 refills | Status: DC
  Filled 2021-07-18: qty 24, 1d supply, fill #0
  Filled 2021-08-15: qty 24, 2d supply, fill #0

## 2021-07-18 NOTE — Progress Notes (Signed)
? ? ?Patient ID: Thomas Snyder, male    DOB: 02-05-1981  MRN: [id number] ? ?CC: Hospitalization Follow-up (Pt has been following up with GI) and Edema (B/l legs and testicles /Pain scale 4-5 out of 10 ) ? ? ?Subjective: ?Thomas Snyder is a 41 y.o. male who presents for new pt visit and hosp f/u ?His concerns today include:  ?Pt with hx of ETOH cirrhosis with ascites, ETOH UD, tob dep, prolong QT ? ?Previous PCP was at [facility].  Last seen 2020.  Changed to [place] because of insurance change. ?This patient was hospitalized 2/18-22/2023 with flank and back pain and jaundice.  Diagnosed with acute alcoholic hepatitis underlying chronic alcoholic liver cirrhosis with portal hypertension in the presence of ongoing alcohol abuse. ?Seen by GI. ?Abdominal ultrasound: Gallbladder wall thickening in the absence of cholelithiasis, hepatomegaly with findings suggestive of hepatic cirrhosis and portal hypertension and perihepatic ascites. ?CT abdomen: Findings compatible with cirrhosis and portal venous hypertension, small esophageal and gastric varices and small volume ascites.  Mild circumferential bowel wall edema involving proximal colon with marked edema involving the mid and distal sigmoid colon and rectum.  Incidental finding of aortic arthrosclerosis. ?-Patient was started on Lasix, Aldactone, Protonix and Coreg. ?Consult to quit EtOH use. ?Hyponatremia noted throughout due to volume overload, improved with diuresis. ?Prolonged QT improved by the time of discharge.  ? ?Today: ?Alcoholic cirrhosis: Seen by Dr. Doyne Keel NP in follow-up posthospital discharge on 06/27/2021.  Plan for EGD and C-scope next month. ?Heavy drinker x20 years.  Last last drink Super Bowl Sunday.  Denies any cravings at this time.  Never went through a treatment program.  Does not feel that he needs to at this point.  States that his current situation is enough of a deterrent for him not to drink again. ?LFTs trending down. ?-On furosemide 20 mg  daily.  Out of spironolactone x3 days. ?Notes swelling of the scrotum, foreskin of the penis, worsening lower extremity edema all the way up to the thigh. ?Abdomen distended.  Thinks it is no worse than when he left the hospital. ?Limiting salt in foods. ?No blood in stools.  He has a macrocytic anemia.  Compliant with taking folic acid and thiamine. ?Works as a Training and development officer in [employer].  Requests to have 4 days off to work on getting the swelling down in his abdomen and legs. ? ?Tobacco dependence: Smokes half a pack a day.  He has been cutting back and using the patches.  Hopes to quit eventually. ? ? ?Patient Active Problem List  ? Diagnosis Date Noted  ? Acute liver failure 06/11/2021  ? Prolonged QT interval 06/11/2021  ? Hypokalemia 06/11/2021  ? Alcohol dependence ([place]) 06/11/2021  ? Tobacco dependence 06/11/2021  ? Fever 06/11/2021  ? Alcoholic hepatitis with ascites   ? Alcoholic cirrhosis of liver with ascites ([place])   ? Jaundice   ?  ? ?Current Outpatient Medications on File Prior to Visit  ?Medication Sig Dispense Refill  ? carvedilol (COREG) 3.125 MG tablet Take 1 tablet (3.125 mg total) by mouth 2 (two) times daily with a meal. 60 tablet 0  ? folic acid (FOLVITE) 1 MG tablet Take 1 tablet (1 mg total) by mouth daily. 30 tablet 0  ? furosemide (LASIX) 40 MG tablet Take 0.5 tablets (20 mg total) by mouth daily. 30 tablet 0  ? Magnesium Hydroxide (DULCOLAX SOFT CHEWS PO) Take 1 tablet by mouth as needed.    ? Multiple Vitamin (MULTIVITAMIN) tablet Take 1 tablet  by mouth daily.    ? Multiple Vitamins-Minerals (CERTAVITE/ANTIOXIDANTS) TABS Take 1 tablet by mouth daily. 30 tablet 0  ? nicotine (NICODERM CQ - DOSED IN MG/24 HOURS) 14 mg/24hr patch Place 1 patch (14 mg total) onto the skin daily. 28 patch 0  ? pantoprazole (PROTONIX) 40 MG tablet Take 1 tablet (40 mg total) by mouth every morning. 30 tablet 3  ? spironolactone (ALDACTONE) 50 MG tablet Take 1 tablet (50 mg total) by mouth daily. 30 tablet 0  ?  thiamine 100 MG tablet Take 1 tablet (100 mg total) by mouth daily. 30 tablet 0  ? ?No current facility-administered medications on file prior to visit.  ? ? ?Allergies  ?Allergen Reactions  ? Milk-Related Compounds   ? Penicillins   ? Shellfish Allergy   ?  Other reaction(s): Other (See Comments) ?Rash   ? ? ?Social History  ? ?Socioeconomic History  ? Marital status: Single  ?  Spouse name: Not on file  ? Number of children: Not on file  ? Years of education: Not on file  ? Highest education level: Not on file  ?Occupational History  ? Not on file  ?Tobacco Use  ? Smoking status: Every Day  ?  Packs/day: 0.50  ?  Types: Cigarettes  ? Smokeless tobacco: Never  ?Vaping Use  ? Vaping Use: Former  ?Substance and Sexual Activity  ? Alcohol use: Not Currently  ?  Alcohol/week: 42.0 standard drinks  ?  Types: 7 Cans of beer, 35 Shots of liquor per week  ?  Comment: drinks every day, at least 1 beer and 5 shots of liquor a day with other days he binges  ? Drug use: Yes  ?  Types: Marijuana  ? Sexual activity: Not on file  ?Other Topics Concern  ? Not on file  ?Social History Narrative  ? Not on file  ? ?Social Determinants of Health  ? ?Financial Resource Strain: Not on file  ?Food Insecurity: Not on file  ?Transportation Needs: Not on file  ?Physical Activity: Not on file  ?Stress: Not on file  ?Social Connections: Not on file  ?Intimate Partner Violence: Not on file  ? ? ?Family History  ?Problem Relation Age of Onset  ? Diabetes Mother   ? Diabetes Father   ? Diabetes Maternal Aunt   ? Diabetes Maternal Uncle   ? Diabetes Maternal Grandmother   ? Colon cancer Neg Hx   ? Colon polyps Neg Hx   ? Esophageal cancer Neg Hx   ? Pancreatic cancer Neg Hx   ? Stomach cancer Neg Hx   ? ? ?Past Surgical History:  ?Procedure Laterality Date  ? NO PAST SURGERIES    ? ? ?ROS: ?Review of Systems ?Negative except as stated above ? ?PHYSICAL EXAM: ?BP 122/87   Pulse (!) 101   Resp 16   Ht '5\' 5"'$  (1.651 m)   Wt 171 lb 12.8 oz (77.9  kg)   SpO2 95%   BMI 28.59 kg/m?   ?Physical Exam ? ?General appearance -middle-age Caucasian male who appears chronically ill. ?Mental status - normal mood, behavior, speech, dress, motor activity, and thought processes ?Eyes -icteric sclera.  Petechiae and veins noted on the face. ?Neck - supple, no significant adenopathy ?Chest - clear to auscultation, no wheezes, rales or rhonchi, symmetric air entry ?Heart - normal rate, regular rhythm, normal S1, S2, no murmurs, rubs, clicks or gallops ?Abdomen -abdomen very distended and tense suggesting ascites.  Caput medusa noted of  the skin of the abdomen. ?Extremities -2+ edema in the entire lower extremities including feet and ankles. ?GU: CMA Carly Mack present: Mild to moderate scrotal edema and edema of the foreskin of the penis ?Skin -petechiae noted on the lower extremities ? ? ? ?  Latest Ref Rng & Units 07/05/2021  ?  9:42 AM 06/27/2021  ?  3:51 PM 06/20/2021  ?  1:15 PM  ?CMP  ?Glucose 70 - 99 mg/dL 138   203   240    ?BUN 6 - 23 mg/dL '7   9   9    '$ ?Creatinine 0.40 - 1.50 mg/dL 0.59   0.57   0.62    ?Sodium 135 - 145 mEq/L 127   127   128    ?Potassium 3.5 - 5.1 mEq/L 3.9   3.9   3.4    ?Chloride 96 - 112 mEq/L 95   92   95    ?CO2 19 - 32 mEq/L '24   24   24    '$ ?Calcium 8.4 - 10.5 mg/dL 8.6   9.1   7.0    ?Total Protein 6.0 - 8.3 g/dL 5.7   6.7   6.3    ?Total Bilirubin 0.2 - 1.2 mg/dL 15.6   20.4   22.4    ?Alkaline Phos 39 - 117 U/L 421   374   335    ?AST 0 - 37 U/L 91   162   145    ?ALT 0 - 53 U/L 55   124   90    ? ?Lipid Panel  ?No results found for: CHOL, TRIG, HDL, CHOLHDL, VLDL, LDLCALC, LDLDIRECT ? ?CBC ?   ?Component Value Date/Time  ? WBC 16.7 (H) 07/05/2021 5176  ? RBC 3.04 (L) 07/05/2021 0942  ? HGB 10.4 (L) 07/05/2021 0942  ? HCT 30.6 (L) 07/05/2021 0942  ? PLT 197.0 07/05/2021 0942  ? MCV 100.9 (H) 07/05/2021 0942  ? MCH 33.8 06/15/2021 0219  ? MCHC 34.1 07/05/2021 0942  ? RDW 16.1 (H) 07/05/2021 1607  ? LYMPHSABS 0.9 06/15/2021 0219  ? MONOABS  1.1 (H) 06/15/2021 0219  ? EOSABS 0.0 06/15/2021 0219  ? BASOSABS 0.0 06/15/2021 0219  ? ? ?ASSESSMENT AND PLAN: ? ?1. Establishing care with new doctor, encounter for ? ? ?2. Alcoholic cirrhosis of liver with ascites

## 2021-07-18 NOTE — Patient Instructions (Signed)
Increase furosemide to 40 mg daily. ?Increase spironolactone to 100 mg daily. ?We will arrange for you to get a paracentesis to have some of the fluid removed from your abdomen to make you more comfortable. ? ?You will stop at the lab today. ?You will also return to the lab after you have been on the increased dose of the furosemide and spironolactone for 1 week. ?

## 2021-07-19 ENCOUNTER — Telehealth (HOSPITAL_COMMUNITY): Payer: Self-pay | Admitting: Radiology

## 2021-07-19 LAB — BASIC METABOLIC PANEL
BUN/Creatinine Ratio: 13 (ref 9–20)
BUN: 8 mg/dL (ref 6–20)
CO2: 23 mmol/L (ref 20–29)
Calcium: 8.7 mg/dL (ref 8.7–10.2)
Chloride: 94 mmol/L — ABNORMAL LOW (ref 96–106)
Creatinine, Ser: 0.61 mg/dL — ABNORMAL LOW (ref 0.76–1.27)
Glucose: 122 mg/dL — ABNORMAL HIGH (ref 70–99)
Potassium: 4.1 mmol/L (ref 3.5–5.2)
Sodium: 131 mmol/L — ABNORMAL LOW (ref 134–144)
eGFR: 126 mL/min/{1.73_m2} (ref >59)

## 2021-07-19 LAB — PROTIME-INR
INR: 1.2 (ref 0.9–1.2)
Prothrombin Time: 13 s — ABNORMAL HIGH (ref 9.1–12.0)

## 2021-07-19 NOTE — Telephone Encounter (Signed)
Pt called and wanted to know what his para appt would entail, did he need to take off work. I told him that was entirely up to him but typically our para patients do fine and actually usually feel better when they leave. He was appreciative of the call back and the info. JM ?

## 2021-07-21 ENCOUNTER — Ambulatory Visit (HOSPITAL_COMMUNITY)
Admission: RE | Admit: 2021-07-21 | Discharge: 2021-07-21 | Disposition: A | Discharge status: Home / Self Care | Payer: 59 | Source: Ambulatory Visit | Attending: Internal Medicine | Admitting: Internal Medicine

## 2021-07-21 ENCOUNTER — Encounter: Payer: Self-pay | Admitting: Internal Medicine

## 2021-07-21 DIAGNOSIS — K7031 Alcoholic cirrhosis of liver with ascites: Secondary | ICD-10-CM | POA: Insufficient documentation

## 2021-07-21 DIAGNOSIS — K7460 Unspecified cirrhosis of liver: Secondary | ICD-10-CM | POA: Diagnosis not present

## 2021-07-21 DIAGNOSIS — R69 Illness, unspecified: Secondary | ICD-10-CM | POA: Diagnosis not present

## 2021-07-21 DIAGNOSIS — R188 Other ascites: Secondary | ICD-10-CM | POA: Diagnosis not present

## 2021-07-21 HISTORY — PX: IR PARACENTESIS: IMG2679

## 2021-07-21 MED ORDER — LIDOCAINE HCL 1 % IJ SOLN
INTRAMUSCULAR | Status: AC
  Filled 2021-07-21: qty 20

## 2021-07-21 MED ORDER — LIDOCAINE HCL 1 % IJ SOLN
INTRAMUSCULAR | Status: DC | PRN
  Administered 2021-07-21: 10 mL

## 2021-07-22 ENCOUNTER — Other Ambulatory Visit: Payer: Self-pay

## 2021-07-22 DIAGNOSIS — K7031 Alcoholic cirrhosis of liver with ascites: Secondary | ICD-10-CM

## 2021-07-22 DIAGNOSIS — N5089 Other specified disorders of the male genital organs: Secondary | ICD-10-CM

## 2021-07-22 MED ORDER — FUROSEMIDE 40 MG PO TABS: 40.0000 mg | ORAL_TABLET | Freq: Every day | ORAL | 6 refills | Status: DC

## 2021-07-23 DIAGNOSIS — F1021 Alcohol dependence, in remission: Secondary | ICD-10-CM | POA: Insufficient documentation

## 2021-07-23 DIAGNOSIS — D539 Nutritional anemia, unspecified: Secondary | ICD-10-CM | POA: Insufficient documentation

## 2021-07-25 ENCOUNTER — Ambulatory Visit: Payer: 59 | Attending: Internal Medicine

## 2021-07-25 DIAGNOSIS — R739 Hyperglycemia, unspecified: Secondary | ICD-10-CM | POA: Diagnosis not present

## 2021-07-25 NOTE — Addendum Note (Signed)
Addended by: Virgina Evener A on: 07/25/2021 06:16 PM ? ? Modules accepted: Orders ? ?

## 2021-07-26 ENCOUNTER — Other Ambulatory Visit (HOSPITAL_COMMUNITY): Payer: Self-pay

## 2021-07-26 LAB — HEMOGLOBIN A1C
Est. average glucose Bld gHb Est-mCnc: 105 mg/dL
Hgb A1c MFr Bld: 5.3 % (ref 4.8–5.6)

## 2021-07-29 ENCOUNTER — Other Ambulatory Visit (HOSPITAL_COMMUNITY): Payer: Self-pay

## 2021-08-01 ENCOUNTER — Ambulatory Visit (INDEPENDENT_AMBULATORY_CARE_PROVIDER_SITE_OTHER): Payer: 59 | Admitting: Gastroenterology

## 2021-08-01 ENCOUNTER — Other Ambulatory Visit (INDEPENDENT_AMBULATORY_CARE_PROVIDER_SITE_OTHER): Payer: 59

## 2021-08-01 DIAGNOSIS — K7031 Alcoholic cirrhosis of liver with ascites: Secondary | ICD-10-CM

## 2021-08-01 DIAGNOSIS — Z23 Encounter for immunization: Secondary | ICD-10-CM

## 2021-08-01 DIAGNOSIS — R69 Illness, unspecified: Secondary | ICD-10-CM | POA: Diagnosis not present

## 2021-08-01 DIAGNOSIS — K7011 Alcoholic hepatitis with ascites: Secondary | ICD-10-CM

## 2021-08-01 LAB — CBC WITH DIFFERENTIAL/PLATELET
Basophils Absolute: 0.1 10*3/uL (ref 0.0–0.1)
Basophils Relative: 1.2 % (ref 0.0–3.0)
Eosinophils Absolute: 0.1 10*3/uL (ref 0.0–0.7)
Eosinophils Relative: 1.2 % (ref 0.0–5.0)
HCT: 33.9 % — ABNORMAL LOW (ref 39.0–52.0)
Hemoglobin: 11.6 g/dL — ABNORMAL LOW (ref 13.0–17.0)
Lymphocytes Relative: 13.2 % (ref 12.0–46.0)
Lymphs Abs: 1.6 10*3/uL (ref 0.7–4.0)
MCHC: 34.4 g/dL (ref 30.0–36.0)
MCV: 95.9 fl (ref 78.0–100.0)
Monocytes Absolute: 1.1 10*3/uL — ABNORMAL HIGH (ref 0.1–1.0)
Monocytes Relative: 9 % (ref 3.0–12.0)
Neutro Abs: 9.2 10*3/uL — ABNORMAL HIGH (ref 1.4–7.7)
Neutrophils Relative %: 75.4 % (ref 43.0–77.0)
Platelets: 178 10*3/uL (ref 150.0–400.0)
RBC: 3.53 Mil/uL — ABNORMAL LOW (ref 4.22–5.81)
RDW: 14.8 % (ref 11.5–15.5)
WBC: 12.3 10*3/uL — ABNORMAL HIGH (ref 4.0–10.5)

## 2021-08-01 LAB — PROTIME-INR
INR: 1.4 ratio — ABNORMAL HIGH (ref 0.8–1.0)
Prothrombin Time: 14.7 s — ABNORMAL HIGH (ref 9.6–13.1)

## 2021-08-01 LAB — HEPATIC FUNCTION PANEL
ALT: 27 U/L (ref 0–53)
AST: 74 U/L — ABNORMAL HIGH (ref 0–37)
Albumin: 3.2 g/dL — ABNORMAL LOW (ref 3.5–5.2)
Alkaline Phosphatase: 365 U/L — ABNORMAL HIGH (ref 39–117)
Bilirubin, Direct: 4.2 mg/dL — ABNORMAL HIGH (ref 0.0–0.3)
Total Bilirubin: 8.7 mg/dL — ABNORMAL HIGH (ref 0.2–1.2)
Total Protein: 7.4 g/dL (ref 6.0–8.3)

## 2021-08-02 ENCOUNTER — Other Ambulatory Visit (HOSPITAL_COMMUNITY): Payer: Self-pay

## 2021-08-12 ENCOUNTER — Other Ambulatory Visit: Payer: Self-pay

## 2021-08-12 DIAGNOSIS — K7031 Alcoholic cirrhosis of liver with ascites: Secondary | ICD-10-CM

## 2021-08-15 ENCOUNTER — Other Ambulatory Visit (HOSPITAL_COMMUNITY): Payer: Self-pay

## 2021-08-17 ENCOUNTER — Ambulatory Visit (AMBULATORY_SURGERY_CENTER): Payer: 59 | Admitting: Gastroenterology

## 2021-08-17 ENCOUNTER — Encounter: Payer: Self-pay | Admitting: Gastroenterology

## 2021-08-17 VITALS — BP 92/56 | HR 77 | Temp 98.3°F | Resp 14 | Ht 65.0 in | Wt 165.0 lb

## 2021-08-17 DIAGNOSIS — I8500 Esophageal varices without bleeding: Secondary | ICD-10-CM | POA: Diagnosis not present

## 2021-08-17 DIAGNOSIS — D127 Benign neoplasm of rectosigmoid junction: Secondary | ICD-10-CM

## 2021-08-17 DIAGNOSIS — K449 Diaphragmatic hernia without obstruction or gangrene: Secondary | ICD-10-CM | POA: Diagnosis not present

## 2021-08-17 DIAGNOSIS — K7460 Unspecified cirrhosis of liver: Secondary | ICD-10-CM | POA: Diagnosis not present

## 2021-08-17 DIAGNOSIS — K640 First degree hemorrhoids: Secondary | ICD-10-CM | POA: Diagnosis not present

## 2021-08-17 DIAGNOSIS — K635 Polyp of colon: Secondary | ICD-10-CM

## 2021-08-17 DIAGNOSIS — K317 Polyp of stomach and duodenum: Secondary | ICD-10-CM

## 2021-08-17 DIAGNOSIS — K5730 Diverticulosis of large intestine without perforation or abscess without bleeding: Secondary | ICD-10-CM | POA: Diagnosis not present

## 2021-08-17 DIAGNOSIS — K7031 Alcoholic cirrhosis of liver with ascites: Secondary | ICD-10-CM

## 2021-08-17 DIAGNOSIS — K2970 Gastritis, unspecified, without bleeding: Secondary | ICD-10-CM

## 2021-08-17 DIAGNOSIS — K766 Portal hypertension: Secondary | ICD-10-CM

## 2021-08-17 DIAGNOSIS — R933 Abnormal findings on diagnostic imaging of other parts of digestive tract: Secondary | ICD-10-CM

## 2021-08-17 MED ORDER — SODIUM CHLORIDE 0.9 % IV SOLN: 500.0000 mL | Freq: Once | INTRAVENOUS | Status: DC

## 2021-08-17 NOTE — Op Note (Signed)
[place] ?Patient Name: Thomas Snyder ?Procedure Date: 08/17/2021 1:27 PM ?MRN: [id number] ?Endoscopist: Carlota Raspberry. Havery Moros , MD ?Age: 41 ?Referring MD:  ?Date of Birth: [birth year] ?Gender: Male ?Account #: [id number] ?Procedure:                Colonoscopy ?Indications:              Abnormal CT of the GI tract - thickening of the  ?                          left colon, also with intermittent rare bleeding  ?                          symptoms ?Medicines:                Monitored Anesthesia Care ?Procedure:                Pre-Anesthesia Assessment: ?                          - Prior to the procedure, a History and Physical  ?                          was performed, and patient medications and  ?                          allergies were reviewed. The patient's tolerance of  ?                          previous anesthesia was also reviewed. The risks  ?                          and benefits of the procedure and the sedation  ?                          options and risks were discussed with the patient.  ?                          All questions were answered, and informed consent  ?                          was obtained. Prior Anticoagulants: The patient has  ?                          taken no previous anticoagulant or antiplatelet  ?                          agents. ASA Grade Assessment: III - A patient with  ?                          severe systemic disease. After reviewing the risks  ?                          and benefits, the patient was deemed in  ?  satisfactory condition to undergo the procedure. ?                          After obtaining informed consent, the colonoscope  ?                          was passed under direct vision. Throughout the  ?                          procedure, the patient's blood pressure, pulse, and  ?                          oxygen saturations were monitored continuously. The  ?                          CF [id number] #[id number] was introduced through  the anus  ?                          and advanced to the the cecum, identified by  ?                          appendiceal orifice and ileocecal valve. The  ?                          colonoscopy was performed without difficulty. The  ?                          patient tolerated the procedure well. The quality  ?                          of the bowel preparation was good. The ileocecal  ?                          valve, appendiceal orifice, and rectum were  ?                          photographed. ?Scope In: 1:43:39 PM ?Scope Out: 1:55:33 PM ?Scope Withdrawal Time: 0 hours 10 minutes 3 seconds  ?Total Procedure Duration: 0 hours 11 minutes 54 seconds  ?Findings:                 The perianal and digital rectal examinations were  ?                          normal. ?                          Multiple small-mouthed diverticula were found in  ?                          the sigmoid colon with superficial erythema in the  ?                          sigmoid colon. ?  A 3 mm polyp was found in the recto-sigmoid colon.  ?                          The polyp was sessile. The polyp was removed with a  ?                          cold snare. Resection and retrieval were complete. ?                          Internal hemorrhoids were found during  ?                          retroflexion. The hemorrhoids were small. ?                          The exam was otherwise without abnormality. ?Complications:            No immediate complications. Estimated blood loss:  ?                          Minimal. ?Estimated Blood Loss:     Estimated blood loss was minimal. ?Impression:               - Diverticulosis in the sigmoid colon. ?                          - One 3 mm polyp at the recto-sigmoid colon,  ?                          removed with a cold snare. Resected and retrieved. ?                          - Internal hemorrhoids. ?                          - The examination was otherwise normal. ?                           No concering findings that would correlate to CT  ?                          scan findings previously ?Recommendation:           - Patient has a contact number available for  ?                          emergencies. The signs and symptoms of potential  ?                          delayed complications were discussed with the  ?                          patient. Return to normal activities tomorrow.  ?                          Written discharge instructions  were provided to the  ?                          patient. ?                          - Resume previous diet. ?                          - Continue present medications. ?                          - Await pathology results. ?Carlota Raspberry. Havery Moros, MD ?08/17/2021 1:59:45 PM ?This report has been signed electronically. ?

## 2021-08-17 NOTE — Op Note (Signed)
[place] ?Patient Name: Thomas Snyder ?Procedure Date: 08/17/2021 1:29 PM ?MRN: [id number] ?Endoscopist: Carlota Raspberry. Havery Moros , MD ?Age: 41 ?Referring MD:  ?Date of Birth: March 03, 1981 ?Gender: Male ?Account #: [id number] ?Procedure:                Upper GI endoscopy ?Indications:              Cirrhosis rule out esophageal varices - appears to  ?                          have been empirically placed on Coreg at some point  ?                          during his course (previously seen in the hospital) ?Medicines:                Monitored Anesthesia Care ?Procedure:                Pre-Anesthesia Assessment: ?                          - Prior to the procedure, a History and Physical  ?                          was performed, and patient medications and  ?                          allergies were reviewed. The patient's tolerance of  ?                          previous anesthesia was also reviewed. The risks  ?                          and benefits of the procedure and the sedation  ?                          options and risks were discussed with the patient.  ?                          All questions were answered, and informed consent  ?                          was obtained. Prior Anticoagulants: The patient has  ?                          taken no previous anticoagulant or antiplatelet  ?                          agents. ASA Grade Assessment: III - A patient with  ?                          severe systemic disease. After reviewing the risks  ?                          and benefits, the patient was deemed in  ?  satisfactory condition to undergo the procedure. ?                          After obtaining informed consent, the endoscope was  ?                          passed under direct vision. Throughout the  ?                          procedure, the patient's blood pressure, pulse, and  ?                          oxygen saturations were monitored continuously. The  ?                           GIF HQ190 #[id number] was introduced through the  ?                          mouth, and advanced to the second part of duodenum.  ?                          The upper GI endoscopy was accomplished without  ?                          difficulty. The patient tolerated the procedure  ?                          well. ?Scope In: ?Scope Out: ?Findings:                 Esophagogastric landmarks were identified: the  ?                          Z-line was found at 38 cm, the gastroesophageal  ?                          junction was found at 38 cm and the upper extent of  ?                          the gastric folds was found at 39 cm from the  ?                          incisors. ?                          A 1 cm hiatal hernia was present. ?                          Varices were found in the lower third of the  ?                          esophagus. They were small to medium in size  ?                          without  any high risk stigmata for bleeding. ?                          The exam of the esophagus was otherwise normal. ?                          Moderate portal hypertensive gastropathy was found  ?                          in the entire examined stomach. Biopsies were taken  ?                          with a cold forceps for Helicobacter pylori testing. ?                          The exam of the stomach was otherwise normal. No  ?                          gastric varices. ?                          A single diminutive sessile polyp was found in the  ?                          second portion of the duodenum. The polyp was  ?                          removed with a cold biopsy forceps. Resection and  ?                          retrieval were complete. ?                          The exam of the duodenum was otherwise normal. ?Complications:            No immediate complications. Estimated blood loss:  ?                          Minimal. ?Estimated Blood Loss:     Estimated blood loss was minimal. ?Impression:                - Esophagogastric landmarks identified. ?                          - 1 cm hiatal hernia. ?                          - Esophageal varices as outlined - small to medium  ?                          in size - no high risk stigmata for bleeding ?                          - Portal hypertensive gastropathy. Biopsied. ?                          -  Normal stomach otherwise ?                          - A single duodenal polyp. Resected and retrieved. ?                          - Normal duodenum otherwise ?Recommendation:           - Patient has a contact number available for  ?                          emergencies. The signs and symptoms of potential  ?                          delayed complications were discussed with the  ?                          patient. Return to normal activities tomorrow.  ?                          Written discharge instructions were provided to the  ?                          patient. ?                          - Resume previous diet. ?                          - Continue present medications including Coreg ?                          - Await pathology results. ?Carlota Raspberry. Deshawn Witty, MD ?08/17/2021 2:04:21 PM ?This report has been signed electronically. ?

## 2021-08-17 NOTE — Progress Notes (Signed)
Called to room to assist during endoscopic procedure.  Patient ID and intended procedure confirmed with present staff. Received instructions for my participation in the procedure from the performing physician.  

## 2021-08-17 NOTE — Progress Notes (Signed)
Pt's states no medical or surgical changes since previsit or office visit. 

## 2021-08-17 NOTE — Progress Notes (Signed)
Report to PACU, RN, vss, BBS= Clear.  

## 2021-08-17 NOTE — Progress Notes (Signed)
[facility] History and Physical ? ? ?Primary Care Physician:  Ladell Pier, MD ? ? ?Reason for Procedure:   Cirrhosis - screening for varices, abnormal CT scan colon ? ?Plan:    EGD and colonoscopy ? ? ? ? ?HPI: Thomas Snyder is a 41 y.o. male  here for EGD to screen for varices, history of EtOH cirrhosis, and colonoscopy to evaluate thickening noted on his colon on prior CT scan. He has rare bleeding symptoms. Now off all alcohol and doing much better, labs looked improved a few weeks ago. Patient denies any bowel symptoms at this time. No family history of colon cancer known. Otherwise feels well without any cardiopulmonary symptoms.  ? ? ?Past Medical History:  ?Diagnosis Date  ? Alcohol dependence ([place])   ? Tobacco dependence   ? ? ?Past Surgical History:  ?Procedure Laterality Date  ? IR PARACENTESIS  07/21/2021  ? NO PAST SURGERIES    ? ? ?Prior to Admission medications   ?Medication Sig Start Date End Date Taking? Authorizing Provider  ?carvedilol (COREG) 3.125 MG tablet Take 1 tablet (3.125 mg total) by mouth 2 (two) times daily with a meal. 07/18/21  Yes Ladell Pier, MD  ?folic acid (FOLVITE) 1 MG tablet Take 1 tablet (1 mg total) by mouth daily. 07/18/21  Yes Ladell Pier, MD  ?furosemide (LASIX) 40 MG tablet Take 1 tablet (40 mg total) by mouth daily. 07/22/21  Yes Ladell Pier, MD  ?Multiple Vitamin (MULTIVITAMIN) tablet Take 1 tablet by mouth daily.   Yes [provider]  ?nicotine (NICODERM CQ - DOSED IN MG/24 HOURS) 14 mg/24hr patch Place 1 patch (14 mg total) onto the skin daily. 06/15/21  Yes Elgergawy, Silver Huguenin, MD  ?pantoprazole (PROTONIX) 40 MG tablet Take 1 tablet (40 mg total) by mouth every morning. 06/27/21  Yes Willia Craze, NP  ?spironolactone (ALDACTONE) 100 MG tablet Take 1 tablet (100 mg total) by mouth daily. 07/18/21  Yes Ladell Pier, MD  ?thiamine 100 MG tablet Take 1 tablet (100 mg total) by mouth daily. 07/18/21  Yes Ladell Pier, MD  ?Magnesium Hydroxide (DULCOLAX SOFT CHEWS PO) Take 1 tablet by mouth as needed.    [provider]  ? ? ?Current Outpatient Medications  ?Medication Sig Dispense Refill  ? carvedilol (COREG) 3.125 MG tablet Take 1 tablet (3.125 mg total) by mouth 2 (two) times daily with a meal. 60 tablet 6  ? folic acid (FOLVITE) 1 MG tablet Take 1 tablet (1 mg total) by mouth daily. 90 tablet 1  ? furosemide (LASIX) 40 MG tablet Take 1 tablet (40 mg total) by mouth daily. 30 tablet 6  ? Multiple Vitamin (MULTIVITAMIN) tablet Take 1 tablet by mouth daily.    ? nicotine (NICODERM CQ - DOSED IN MG/24 HOURS) 14 mg/24hr patch Place 1 patch (14 mg total) onto the skin daily. 28 patch 0  ? pantoprazole (PROTONIX) 40 MG tablet Take 1 tablet (40 mg total) by mouth every morning. 30 tablet 3  ? spironolactone (ALDACTONE) 100 MG tablet Take 1 tablet (100 mg total) by mouth daily. 30 tablet 4  ? thiamine 100 MG tablet Take 1 tablet (100 mg total) by mouth daily. 30 tablet 3  ? Magnesium Hydroxide (DULCOLAX SOFT CHEWS PO) Take 1 tablet by mouth as needed.    ? ?Current Facility-Administered Medications  ?Medication Dose Route Frequency Provider Last Rate Last Admin  ? 0.9 %  sodium chloride infusion  500 mL  Intravenous Once Markan Cazarez, Carlota Raspberry, MD      ? ? ?Allergies as of 08/17/2021 - Review Complete 08/17/2021  ?Allergen Reaction Noted  ? Milk-related compounds  06/11/2021  ? Penicillins  06/11/2021  ? Shellfish allergy  07/03/2018  ? ? ?Family History  ?Problem Relation Age of Onset  ? Diabetes Mother   ? Diabetes Father   ? Diabetes Maternal Aunt   ? Diabetes Maternal Uncle   ? Diabetes Maternal Grandmother   ? Colon cancer Neg Hx   ? Colon polyps Neg Hx   ? Esophageal cancer Neg Hx   ? Pancreatic cancer Neg Hx   ? Stomach cancer Neg Hx   ? ? ?Social History  ? ?Socioeconomic History  ? Marital status: Single  ?  Spouse name: Not on file  ? Number of children: Not on file  ? Years of education: Not on file  ?  Highest education level: Not on file  ?Occupational History  ? Not on file  ?Tobacco Use  ? Smoking status: Every Day  ?  Packs/day: 0.50  ?  Types: Cigarettes  ? Smokeless tobacco: Never  ?Vaping Use  ? Vaping Use: Former  ?Substance and Sexual Activity  ? Alcohol use: Not Currently  ?  Alcohol/week: 42.0 standard drinks  ?  Types: 7 Cans of beer, 35 Shots of liquor per week  ?  Comment: drinks every day, at least 1 beer and 5 shots of liquor a day with other days he binges  ? Drug use: Yes  ?  Types: Marijuana  ? Sexual activity: Not on file  ?Other Topics Concern  ? Not on file  ?Social History Narrative  ? Not on file  ? ?Social Determinants of Health  ? ?Financial Resource Strain: Not on file  ?Food Insecurity: Not on file  ?Transportation Needs: Not on file  ?Physical Activity: Not on file  ?Stress: Not on file  ?Social Connections: Not on file  ?Intimate Partner Violence: Not on file  ? ? ?Review of Systems: ?All other review of systems negative except as mentioned in the HPI. ? ?Physical Exam: ?Vital signs ?BP 104/61   Pulse 75   Temp 98.3 ?F (36.8 ?C)   Resp 16   Ht '5\' 5"'$  (1.651 m)   Wt 165 lb (74.8 kg)   SpO2 99%   BMI 27.46 kg/m?  ? ?General:   Alert,  Well-developed, pleasant and cooperative in NAD ?Lungs:  Clear throughout to auscultation.   ?Heart:  Regular rate and rhythm ?Abdomen:  Soft, nontender and nondistended.   ?Neuro/Psych:  Alert and cooperative. Normal mood and affect. A and O x 3 ? ?Jolly Mango, MD ?[facility] ? ? ?

## 2021-08-17 NOTE — Patient Instructions (Signed)
Handout on polyps, diverticulosis,hiatal hernia, hemorrhoids, and gastris provided  ? ?Await pathology results.  ? ?Continue current medications.  ?YOU HAD AN ENDOSCOPIC PROCEDURE TODAY AT [facility]:   Refer to the procedure report that was given to you for any specific questions about what was found during the examination.  If the procedure report does not answer your questions, please call your gastroenterologist to clarify.  If you requested that your care partner not be given the details of your procedure findings, then the procedure report has been included in a sealed envelope for you to review at your convenience later. ? ?YOU SHOULD EXPECT: Some feelings of bloating in the abdomen. Passage of more gas than usual.  Walking can help get rid of the air that was put into your GI tract during the procedure and reduce the bloating. If you had a lower endoscopy (such as a colonoscopy or flexible sigmoidoscopy) you may notice spotting of blood in your stool or on the toilet paper. If you underwent a bowel prep for your procedure, you may not have a normal bowel movement for a few days. ? ?Please Note:  You might notice some irritation and congestion in your nose or some drainage.  This is from the oxygen used during your procedure.  There is no need for concern and it should clear up in a day or so. ? ?SYMPTOMS TO REPORT IMMEDIATELY: ? ?Following lower endoscopy (colonoscopy or flexible sigmoidoscopy): ? Excessive amounts of blood in the stool ? Significant tenderness or worsening of abdominal pains ? Swelling of the abdomen that is new, acute ? Fever of 100?F or higher ? ?Following upper endoscopy (EGD) ? Vomiting of blood or coffee ground material ? New chest pain or pain under the shoulder blades ? Painful or persistently difficult swallowing ? New shortness of breath ? Fever of 100?F or higher ? Black, tarry-looking stools ? ?For urgent or emergent issues, a gastroenterologist can be reached  at any hour by calling [phone]. ?Do not use MyChart messaging for urgent concerns.  ? ? ?DIET:  We do recommend a small meal at first, but then you may proceed to your regular diet.  Drink plenty of fluids but you should avoid alcoholic beverages for 24 hours. ? ?ACTIVITY:  You should plan to take it easy for the rest of today and you should NOT DRIVE or use heavy machinery until tomorrow (because of the sedation medicines used during the test).   ? ?FOLLOW UP: ?Our staff will call the number listed on your records 48-72 hours following your procedure to check on you and address any questions or concerns that you may have regarding the information given to you following your procedure. If we do not reach you, we will leave a message.  We will attempt to reach you two times.  During this call, we will ask if you have developed any symptoms of COVID 19. If you develop any symptoms (ie: fever, flu-like symptoms, shortness of breath, cough etc.) before then, please call [phone].  If you test positive for Covid 19 in the 2 weeks post procedure, please call and report this information to [place].   ? ?If any biopsies were taken you will be contacted by phone or by letter within the next 1-3 weeks.  Please call us at [phone] if you have not heard about the biopsies in 3 weeks.  ? ? ?SIGNATURES/CONFIDENTIALITY: ?You and/or your care partner have signed paperwork which will be entered  into your electronic medical record.  These signatures attest to the fact that that the information above on your After Visit Summary has been reviewed and is understood.  Full responsibility of the confidentiality of this discharge information lies with you and/or your care-partner. ? ? ?

## 2021-08-19 ENCOUNTER — Telehealth: Payer: Self-pay | Admitting: *Deleted

## 2021-08-19 NOTE — Telephone Encounter (Signed)
?  Follow up Call- ? ? ?  08/17/2021  ? 12:48 PM  ?Call back number  ?Post procedure Call Back phone  # [phone]  ?Permission to leave phone message Yes  ?  ? ?Patient questions: ? ?Do you have a fever, pain , or abdominal swelling? No. ?Pain Score  0 * ? ?Have you tolerated food without any problems? Yes.   ? ?Have you been able to return to your normal activities? Yes.   ? ?Do you have any questions about your discharge instructions: ?Diet   No. ?Medications  No. ?Follow up visit  No. ? ?Do you have questions or concerns about your Care? No. ? ?Actions: ?* If pain score is 4 or above: ?No action needed, pain <4. ? ? ?

## 2021-09-20 ENCOUNTER — Telehealth: Payer: Self-pay

## 2021-09-20 NOTE — Telephone Encounter (Signed)
Patient called asking if his lab order could be given to the [facility] in [place] to be more convenient for him.  Please call and advise.

## 2021-09-20 NOTE — Telephone Encounter (Signed)
Spoke with pt and let him know he is due for follow up lab work. Pt verbalized understanding and had no other concerns at end of call.

## 2021-09-20 NOTE — Addendum Note (Signed)
Addended by: Berniece Salines A on: 09/20/2021 09:44 AM   Modules accepted: Orders

## 2021-09-20 NOTE — Telephone Encounter (Signed)
Spoke with pt to confirm which [facility] he wanted to use. Pt stated he wanted to go to [facility] at [place], [place] [address]. Labs faxed to [phone].

## 2021-09-20 NOTE — Telephone Encounter (Signed)
-----   Message from Algernon Huxley, RN sent at 08/12/2021 10:00 AM EDT ----- Regarding: Labs Pt needs labs in 5 weeks. Orders in epic.

## 2021-09-26 ENCOUNTER — Other Ambulatory Visit: Payer: Self-pay | Admitting: Nurse Practitioner

## 2021-09-26 DIAGNOSIS — R69 Illness, unspecified: Secondary | ICD-10-CM | POA: Diagnosis not present

## 2021-09-27 LAB — CBC WITH DIFFERENTIAL/PLATELET
Basophils Absolute: 0.1 10*3/uL (ref 0.0–0.2)
Basos: 1 %
EOS (ABSOLUTE): 0.3 10*3/uL (ref 0.0–0.4)
Eos: 4 %
Hematocrit: 30.3 % — ABNORMAL LOW (ref 37.5–51.0)
Hemoglobin: 10.6 g/dL — ABNORMAL LOW (ref 13.0–17.7)
Immature Grans (Abs): 0 10*3/uL (ref 0.0–0.1)
Immature Granulocytes: 0 %
Lymphocytes Absolute: 1.7 10*3/uL (ref 0.7–3.1)
Lymphs: 23 %
MCH: 31.2 pg (ref 26.6–33.0)
MCHC: 35 g/dL (ref 31.5–35.7)
MCV: 89 fL (ref 79–97)
Monocytes Absolute: 0.8 10*3/uL (ref 0.1–0.9)
Monocytes: 10 %
Neutrophils Absolute: 4.7 10*3/uL (ref 1.4–7.0)
Neutrophils: 62 %
Platelets: 126 10*3/uL — ABNORMAL LOW (ref 150–450)
RBC: 3.4 x10E6/uL — ABNORMAL LOW (ref 4.14–5.80)
RDW: 12.8 % (ref 11.6–15.4)
WBC: 7.6 10*3/uL (ref 3.4–10.8)

## 2021-09-27 LAB — HEPATIC FUNCTION PANEL
ALT: 14 IU/L (ref 0–44)
AST: 31 IU/L (ref 0–40)
Albumin: 3.9 g/dL — ABNORMAL LOW (ref 4.0–5.0)
Alkaline Phosphatase: 164 IU/L — ABNORMAL HIGH (ref 44–121)
Bilirubin Total: 1.4 mg/dL — ABNORMAL HIGH (ref 0.0–1.2)
Bilirubin, Direct: 0.73 mg/dL — ABNORMAL HIGH (ref 0.00–0.40)
Total Protein: 7.2 g/dL (ref 6.0–8.5)

## 2021-09-27 LAB — PROTIME-INR
INR: 1.1 (ref 0.9–1.2)
Prothrombin Time: 11.9 s (ref 9.1–12.0)

## 2021-10-10 ENCOUNTER — Telehealth: Payer: Self-pay

## 2021-10-10 NOTE — Telephone Encounter (Signed)
Left message for pt to call back  °

## 2021-10-10 NOTE — Telephone Encounter (Signed)
-----   Message from Willia Craze, NP sent at 10/07/2021  3:15 PM EDT ----- Mickel Baas,  Please get this patient an appt with Dr. Havery Moros in mid to late July for follow up on cirrhosis. Thanks

## 2021-10-13 NOTE — Telephone Encounter (Signed)
Patient returned your call, please advise. 

## 2021-10-18 ENCOUNTER — Ambulatory Visit: Payer: 59 | Admitting: Internal Medicine

## 2021-11-18 ENCOUNTER — Encounter: Payer: Self-pay | Admitting: Gastroenterology

## 2021-11-18 ENCOUNTER — Other Ambulatory Visit (INDEPENDENT_AMBULATORY_CARE_PROVIDER_SITE_OTHER): Payer: 59

## 2021-11-18 ENCOUNTER — Ambulatory Visit (INDEPENDENT_AMBULATORY_CARE_PROVIDER_SITE_OTHER): Payer: 59 | Admitting: Gastroenterology

## 2021-11-18 VITALS — BP 104/62 | HR 70 | Ht 66.0 in | Wt 165.0 lb

## 2021-11-18 DIAGNOSIS — D649 Anemia, unspecified: Secondary | ICD-10-CM

## 2021-11-18 DIAGNOSIS — R5383 Other fatigue: Secondary | ICD-10-CM

## 2021-11-18 DIAGNOSIS — I8500 Esophageal varices without bleeding: Secondary | ICD-10-CM

## 2021-11-18 DIAGNOSIS — K7030 Alcoholic cirrhosis of liver without ascites: Secondary | ICD-10-CM

## 2021-11-18 DIAGNOSIS — R69 Illness, unspecified: Secondary | ICD-10-CM | POA: Diagnosis not present

## 2021-11-18 LAB — BASIC METABOLIC PANEL
BUN: 13 mg/dL (ref 6–23)
CO2: 21 mEq/L (ref 19–32)
Calcium: 10.2 mg/dL (ref 8.4–10.5)
Chloride: 106 mEq/L (ref 96–112)
Creatinine, Ser: 0.65 mg/dL (ref 0.40–1.50)
GFR: 119.25 mL/min (ref >60.00)
Glucose, Bld: 117 mg/dL — ABNORMAL HIGH (ref 70–99)
Potassium: 4.3 mEq/L (ref 3.5–5.1)
Sodium: 135 mEq/L (ref 135–145)

## 2021-11-18 NOTE — Progress Notes (Signed)
HPI :  41 year old male here for a follow-up visit for alcoholic cirrhosis.  Recall he was hospitalized in February when he presented with nausea vomiting, abdominal pain.  He was found to have alcoholic hepatitis with a bilirubin of 24-1/2, elevations in his transaminases with leukocytosis.  Imaging with CT scan showed changes of cirrhosis with portal hypertension and varices.  He had mild ascites at the time.  Discriminant function met criteria for steroids at the time and prednisone was started.  Over his course of it it did not seem to improve his numbers significantly within the first few weeks it was discontinued.  He had a full serologic work-up which showed no cause of cirrhosis otherwise.  His ferritin was elevated thought to be acute inflammatory reactant, he tested negative for genetic testing for hemochromatosis.  Fortunately he has been completely abstinent from alcohol since his admission.  He previously drank at least 3 beers per day and 5 shots of hard liquor at night.  This was ongoing for longstanding time.  He is in a really good job with this.  He was vaccinated to have a and B, next due for his hep A follow-up in September.  Over time he has considerably improved with alcohol abstinence.  His jaundice has resolved, bili went from 24 down to 1 as of last June.  His INR has normalized, previously was 1.7, now 1.1.  He is on Lasix 40 mg daily and Aldactone 100 mg daily for lower extremity edema which is working pretty well to control.  He has no ascites.  He did have an EGD which confirmed esophageal varices, he is on Coreg for that and tolerating it well.  His main complaint is low energy at this point and feeling fatigued despite sleeping well enough.  He has no symptoms of encephalopathy.  He has had a chronic anemia with macrocytosis from alcohol previously.  His iron studies and B12 levels looked okay.  Prior workup: [place] 06/11/21: IMPRESSION: 1. Gallbladder wall thickening in the  absence of cholelithiasis. 2. Hepatic steatosis and hepatomegaly with additional findings suggestive of hepatic cirrhosis. 3. No flow within the main portal vein which may represent sequelae associated with extensive portal hypertension. Correlation with contrast-enhanced abdomen pelvis CT is recommended, as portal vein thrombosis cannot be excluded. 4. Perihepatic ascites which may represent the source of the previously noted gallbladder wall thickening.  CT abdomen / pelvis 06/11/21: IMPRESSION: 1. Morphologic features of the liver compatible with cirrhosis. There is a hepatic steatosis and marked hepatomegaly. Confluent fibrosis of the medial segment of left hepatic lobe noted. 2. Stigmata of portal venous hypertension with recanalized umbilical vein, small esophageal and gastric varices, and small volume of ascites. 3. Mild circumferential bowel wall edema involving the proximal colon with marked edema involving the mid and distal sigmoid colon and rectum. Findings are nonspecific and may reflect underlying colitis versus hepatic colopathy 4. Aortic Atherosclerosis (ICD10-I70.0).  [place] 06/12/21: IMPRESSION: Trace perihepatic ascites. Paracentesis not performed due to the small amount of fluid.  EGD 08/17/21: - A 1 cm hiatal hernia was present. - Varices were found in the lower third of the esophagus. They were small to medium in size without any high risk stigmata for bleeding. - The exam of the esophagus was otherwise normal. - Moderate portal hypertensive gastropathy was found in the entire examined stomach. Biopsies were taken with a cold forceps for Helicobacter pylori testing. - The exam of the stomach was otherwise normal. No gastric varices. -  A single diminutive sessile polyp was found in the second portion of the duodenum. The polyp was removed with a cold biopsy forceps. Resection and retrieval were complete. - The exam of the duodenum was otherwise  normal.  Colonoscopy 08/17/21: The perianal and digital rectal examinations were normal. - Multiple small-mouthed diverticula were found in the sigmoid colon with superficial erythema in the sigmoid colon. - A 3 mm polyp was found in the recto-sigmoid colon. The polyp was sessile. The polyp was removed with a cold snare. Resection and retrieval were complete. - Internal hemorrhoids were found during retroflexion. The hemorrhoids were small. - The exam was otherwise without abnormality.  1. Surgical [P], duodenal polyp, polyp (1) POLYPOID FRAGMENTS OF DUODENAL MUCOSA. THERE ARE NO DIAGNOSTIC FEATURES OF CELIAC DISEASE. NEGATIVE FOR ADENOMATOUS CHANGE AND NEOPLASM. 2. Surgical [P], gastric antrum and gastric body FRAGMENTS OF GASTRIC MUCOSA WITH NO SIGNIFICANT MICROSCOPIC ABNORMALITIES. H. PYLORI, INTESTINAL METAPLASIA, ATROPHY AND DYSPLASIA ARE NOT IDENTIFIED. 3. Surgical [P], colon, recto-sigmoid, polyp (1) HYPERPLASTIC POLYP. NEGATIVE FOR DYSPLASIA.    Past Medical History:  Diagnosis Date   Alcohol dependence ([place])    Cirrhosis, alcoholic ([place])    Esophageal varices ([place])    Tobacco dependence      Past Surgical History:  Procedure Laterality Date   IR PARACENTESIS  07/21/2021   NO PAST SURGERIES     Family History  Problem Relation Age of Onset   Diabetes Mother    Diabetes Father    Diabetes Maternal Aunt    Diabetes Maternal Uncle    Diabetes Maternal Grandmother    Colon cancer Neg Hx    Colon polyps Neg Hx    Esophageal cancer Neg Hx    Pancreatic cancer Neg Hx    Stomach cancer Neg Hx    Social History   Tobacco Use   Smoking status: Every Day    Packs/day: 0.50    Types: Cigarettes   Smokeless tobacco: Never  Vaping Use   Vaping Use: Former  Substance Use Topics   Alcohol use: Not Currently    Alcohol/week: 42.0 standard drinks of alcohol    Types: 7 Cans of beer, 35 Shots of liquor per week    Comment: drinks every day, at least 1 beer and 5  shots of liquor a day with other days he binges   Drug use: Yes    Types: Marijuana   Current Outpatient Medications  Medication Sig Dispense Refill   carvedilol (COREG) 3.125 MG tablet Take 1 tablet (3.125 mg total) by mouth 2 (two) times daily with a meal. 60 tablet 6   folic acid (FOLVITE) 1 MG tablet Take 1 tablet (1 mg total) by mouth daily. 90 tablet 1   furosemide (LASIX) 40 MG tablet Take 1 tablet (40 mg total) by mouth daily. 30 tablet 6   Magnesium Hydroxide (DULCOLAX SOFT CHEWS PO) Take 1 tablet by mouth as needed.     Multiple Vitamin (MULTIVITAMIN) tablet Take 1 tablet by mouth daily.     nicotine (NICODERM CQ - DOSED IN MG/24 HOURS) 14 mg/24hr patch Place 1 patch (14 mg total) onto the skin daily. 28 patch 0   pantoprazole (PROTONIX) 40 MG tablet Take 1 tablet (40 mg total) by mouth every morning. 30 tablet 3   spironolactone (ALDACTONE) 100 MG tablet Take 1 tablet (100 mg total) by mouth daily. 30 tablet 4   thiamine 100 MG tablet Take 1 tablet (100 mg total) by mouth daily. 30 tablet 3  No current facility-administered medications for this visit.   Allergies  Allergen Reactions   Milk-Related Compounds    Penicillins    Shellfish Allergy     Other reaction(s): Other (See Comments) Rash      Review of Systems: All systems reviewed and negative except where noted in HPI.   Lab Results  Component Value Date   WBC 7.6 09/26/2021   HGB 10.6 (L) 09/26/2021   HCT 30.3 (L) 09/26/2021   MCV 89 09/26/2021   PLT 126 (L) 09/26/2021    Lab Results  Component Value Date   CREATININE 0.61 (L) 07/18/2021   BUN 8 07/18/2021   NA 131 (L) 07/18/2021   K 4.1 07/18/2021   CL 94 (L) 07/18/2021   CO2 23 07/18/2021    Lab Results  Component Value Date   ALT 14 09/26/2021   AST 31 09/26/2021   ALKPHOS 164 (H) 09/26/2021   BILITOT 1.4 (H) 09/26/2021    Lab Results  Component Value Date   INR 1.1 09/26/2021   INR 1.4 (H) 08/01/2021   INR 1.2 07/18/2021    MELD  3.0: 26 at 07/05/2021  9:42 AM MELD-Na: 28 at 07/05/2021  9:42 AM Calculated from: Serum Creatinine: 0.59 mg/dL (Using min of 1 mg/dL) at 07/05/2021  9:42 AM Serum Sodium: 127 mEq/L at 07/05/2021  9:42 AM Total Bilirubin: 15.6 mg/dL at 07/05/2021  9:42 AM Serum Albumin: 2.8 g/dL at 07/05/2021  9:42 AM INR(ratio): 1.6 ratio at 07/05/2021  9:42 AM Age at listing (hypothetical): 40 years Sex: Male at 07/05/2021  9:42 AM    Physical Exam: BP 104/62   Pulse 70   Ht _0  (1.676 m)   Wt 165 lb (74.8 kg)   SpO2 98%   BMI 26.63 kg/m  Constitutional: Pleasant,well-developed, male in no acute distress. Neurological: Alert and oriented to person place and time. Psychiatric: Normal mood and affect. Behavior is normal.   ASSESSMENT: 41 y.o. male here for assessment of the following  1. Alcoholic cirrhosis, unspecified whether ascites present ([place])   2. Esophageal varices without bleeding, unspecified esophageal varices type ([place])   3. Anemia, unspecified type   4. Other fatigue    Patient presented initially with alcoholic hepatitis superimposed on decompensated cirrhosis, jaundice, small volume ascites, esophageal varices.  With alcohol abstinence he has done incredibly well.  His bilirubin has normalized, INR has normalized.  This took several months but he is doing much better at this point time.  We discussed his cirrhosis in general, risks for decompensation moving forward.  He is on Coreg for varices and will continue that.  He is on Lasix and Aldactone for his edema and small volume ascites.  This is working well for him but he has not had renal function testing in some time, will send him to the lab to make sure stable.  He has a lot of fatigue still, could be in part due to his anemia.  I think his anemia was likely driven by bone marrow suppression in setting of alcohol use.  Hopefully that may improve with time, his MCV is normalizing.  I will check a testosterone level given risk for  hypogonadism with cirrhosis and alcohol use.  He is due for [facility] screening with another ultrasound in October, will also recheck his labs at that time.  He is due for his next hep A vaccine and September.  I will see him at least every 6 months if not sooner with any issues.  Continued alcohol abstinence recommended.   PLAN: - lab today - BMET and testosterone level - recall [place] in October - CMET, INR, AFP, CBC in October - follow up in the office in January - continued alcohol abstinence - hep A / B vaccine in September  Jolly Mango, MD [facility]

## 2021-11-18 NOTE — Patient Instructions (Addendum)
If you are age 39 or older, your body mass index should be between 23-30. Your Body mass index is 26.63 kg/m. If this is out of the aforementioned range listed, please consider follow up with your Primary Care Provider.  If you are age 71 or younger, your body mass index should be between 19-25. Your Body mass index is 26.63 kg/m. If this is out of the aformentioned range listed, please consider follow up with your Primary Care Provider.   ________________________________________________________  The [facility] providers would like to encourage you to use [facility] to communicate with providers for non-urgent requests or questions.  Due to long hold times on the telephone, sending your provider a message by [facility] may be a faster and more efficient way to get a response.  Please allow 48 business hours for a response.  Please remember that this is for non-urgent requests.  _______________________________________________________  Please go to the lab in the basement of our building to have lab work done as you leave today. Hit "B" for basement when you get on the elevator.  When the doors open the lab is on your left.  We will call you with the results. Thank you.  You will be due for a recall Ultrasound and labs in October 2023. We will remind you when it is time to get scheduled.  We would like to see you back in the office in January.  Thank you for entrusting me with your care and for choosing [facility], Dr. Chrisney Cellar

## 2021-11-24 ENCOUNTER — Encounter: Payer: Self-pay | Admitting: Internal Medicine

## 2021-11-24 ENCOUNTER — Other Ambulatory Visit: Payer: Self-pay | Admitting: Internal Medicine

## 2021-11-24 DIAGNOSIS — N5089 Other specified disorders of the male genital organs: Secondary | ICD-10-CM

## 2021-11-24 DIAGNOSIS — K7031 Alcoholic cirrhosis of liver with ascites: Secondary | ICD-10-CM

## 2021-11-24 MED ORDER — PANTOPRAZOLE SODIUM 40 MG PO TBEC: 40.0000 mg | DELAYED_RELEASE_TABLET | Freq: Every morning | ORAL | 3 refills | Status: DC

## 2021-11-24 MED ORDER — FUROSEMIDE 40 MG PO TABS: 40.0000 mg | ORAL_TABLET | Freq: Every day | ORAL | 3 refills | Status: DC

## 2021-11-25 LAB — TESTOSTERONE, FREE, TOTAL, SHBG
Sex Hormone Binding: 115 nmol/L — ABNORMAL HIGH (ref 16.5–55.9)
Testosterone, Free: 7.5 pg/mL — ABNORMAL LOW (ref 8.7–25.1)
Testosterone: 861 ng/dL (ref 264–916)

## 2022-01-11 ENCOUNTER — Telehealth: Payer: Self-pay

## 2022-01-11 ENCOUNTER — Other Ambulatory Visit: Payer: Self-pay

## 2022-01-11 DIAGNOSIS — D649 Anemia, unspecified: Secondary | ICD-10-CM

## 2022-01-11 DIAGNOSIS — K7030 Alcoholic cirrhosis of liver without ascites: Secondary | ICD-10-CM

## 2022-01-11 DIAGNOSIS — I8500 Esophageal varices without bleeding: Secondary | ICD-10-CM

## 2022-01-11 NOTE — Telephone Encounter (Signed)
Called patient. Left message that he is due for RUQ ultrasound and labs in October.  Patient is from [place], [place]. Asked that the patient let us know where he would like to have imaging and lab

## 2022-01-11 NOTE — Progress Notes (Signed)
Patient due for RUQ U/S and labs

## 2022-01-11 NOTE — Telephone Encounter (Signed)
-----   Message from Roetta Sessions, [place] sent at 11/18/2021 11:39 AM EDT ----- Regarding: due for RUQ U/S and labs in October Patient will be due for RUQ U/S for [facility] screening Labs : CBC, AFP, CMET and P T/INR

## 2022-01-12 NOTE — Telephone Encounter (Signed)
Called and spoke to patient.  He would like to have U/S and labs on 10-2 when he is in town for another Dr.'s appointment Karle Plumber at [place]) which is at 3:50 pm.  Patient understands he will need to go to lab in our basement to get labs done that day.  Furthermore he is over due for his 2nd and final Hepatitis A vaccine.Patient indicated appointment info for each appointment can be sent to his MyChart account. Called and scheduled patient for RUQ U/S at [facility] on 1:30 pm NPO 6  hours prior.  Arrive at 1:00pm at [facility].  Patient has been scheduled for a nurse visit at 2:30pm to get his Hep A and can go to the lab when he is in the building for his blood work.  MyChart message sent to patient with appointment information

## 2022-01-23 ENCOUNTER — Ambulatory Visit (HOSPITAL_COMMUNITY): Payer: 59

## 2022-01-23 ENCOUNTER — Encounter: Payer: Self-pay | Admitting: Internal Medicine

## 2022-01-23 ENCOUNTER — Ambulatory Visit: Payer: 59 | Attending: Internal Medicine | Admitting: Internal Medicine

## 2022-01-23 ENCOUNTER — Ambulatory Visit (INDEPENDENT_AMBULATORY_CARE_PROVIDER_SITE_OTHER): Payer: 59 | Admitting: Gastroenterology

## 2022-01-23 VITALS — BP 114/77 | HR 90 | Temp 98.4°F | Ht 66.0 in | Wt 161.0 lb

## 2022-01-23 DIAGNOSIS — Z23 Encounter for immunization: Secondary | ICD-10-CM

## 2022-01-23 DIAGNOSIS — K7030 Alcoholic cirrhosis of liver without ascites: Secondary | ICD-10-CM

## 2022-01-23 DIAGNOSIS — D539 Nutritional anemia, unspecified: Secondary | ICD-10-CM

## 2022-01-23 DIAGNOSIS — F1021 Alcohol dependence, in remission: Secondary | ICD-10-CM | POA: Diagnosis not present

## 2022-01-23 DIAGNOSIS — R69 Illness, unspecified: Secondary | ICD-10-CM | POA: Diagnosis not present

## 2022-01-23 NOTE — Progress Notes (Signed)
Patient ID: Thomas Snyder, male    DOB: 14-Nov-1980  MRN: [id number]  CC: Cirrhosis (Cirrhosis f/u. No questions/ questions. Velta Addison to flu vax.)   Subjective: Thomas Snyder is a 41 y.o. male who presents for chronic ds management His concerns today include:  Pt with hx of ETOH cirrhosis with ascites, ETOH UD, tob dep, prolong QT  Patient was last seen by me in March of this year.  At that time he had tense ascites and significant lower extremity edema associated with alcoholic cirrhosis.  We had referred him for paracentesis and had increased spironolactone to 100 mg daily and furosemide to 40 mg daily.  Since then, patient reports he has been doing well.  He is completely abstain from alcohol.  He has no swelling in the legs and no ascites.  No early satiety.  He has been following with Dr. Havery Moros.  Last set of liver enzymes I see in the system was 09/26/2021.  AST and ALT had normalized.  Alkaline phosphatase was trending downward and total bilirubin had significantly decreased and was at 1.4.  He still had a mild anemia with hemoglobin of 10.6 and hematocrit of 30. He reports compliance with taking spironolactone and furosemide. He was scheduled for liver ultrasound today but it had to be rescheduled because he was chewing gum.   Patient Active Problem List   Diagnosis Date Noted   Alcohol use disorder, severe, in early remission ([place]) 07/23/2021   Macrocytic anemia 07/23/2021   Acute liver failure 06/11/2021   Prolonged QT interval 06/11/2021   Hypokalemia 06/11/2021   Alcohol dependence ([place]) 06/11/2021   Tobacco dependence 06/11/2021   Fever 50/93/2671   Alcoholic hepatitis with ascites    Alcoholic cirrhosis of liver with ascites (HCC)    Jaundice      Current Outpatient Medications on File Prior to Visit  Medication Sig Dispense Refill   carvedilol (COREG) 3.125 MG tablet Take 1 tablet (3.125 mg total) by mouth 2 (two) times daily with a meal. 60 tablet 6   folic  acid (FOLVITE) 1 MG tablet Take 1 tablet (1 mg total) by mouth daily. 90 tablet 1   furosemide (LASIX) 40 MG tablet Take 1 tablet (40 mg total) by mouth daily. 30 tablet 3   Magnesium Hydroxide (DULCOLAX SOFT CHEWS PO) Take 1 tablet by mouth as needed.     Multiple Vitamin (MULTIVITAMIN) tablet Take 1 tablet by mouth daily.     nicotine (NICODERM CQ - DOSED IN MG/24 HOURS) 14 mg/24hr patch Place 1 patch (14 mg total) onto the skin daily. 28 patch 0   spironolactone (ALDACTONE) 100 MG tablet Take 1 tablet (100 mg total) by mouth daily. 30 tablet 4   thiamine 100 MG tablet Take 1 tablet (100 mg total) by mouth daily. 30 tablet 3   lansoprazole (PREVACID) 30 MG capsule Take 1 capsule (30 mg total) by mouth daily. (Patient not taking: Reported on 01/23/2022) 30 capsule 3   No current facility-administered medications on file prior to visit.    Allergies  Allergen Reactions   Milk-Related Compounds    Penicillins    Shellfish Allergy     Other reaction(s): Other (See Comments) Rash     Social History   Socioeconomic History   Marital status: Single    Spouse name: Not on file   Number of children: Not on file   Years of education: Not on file   Highest education level: Not on file  Occupational History  Not on file  Tobacco Use   Smoking status: Every Day    Packs/day: 0.50    Types: Cigarettes   Smokeless tobacco: Never  Vaping Use   Vaping Use: Former  Substance and Sexual Activity   Alcohol use: Not Currently    Alcohol/week: 42.0 standard drinks of alcohol    Types: 7 Cans of beer, 35 Shots of liquor per week    Comment: drinks every day, at least 1 beer and 5 shots of liquor a day with other days he binges   Drug use: Yes    Types: Marijuana   Sexual activity: Not on file  Other Topics Concern   Not on file  Social History Narrative   Not on file   Social Determinants of Health   Financial Resource Strain: Not on file  Food Insecurity: Not on file   Transportation Needs: Not on file  Physical Activity: Not on file  Stress: Not on file  Social Connections: Not on file  Intimate Partner Violence: Not on file    Family History  Problem Relation Age of Onset   Diabetes Mother    Diabetes Father    Diabetes Maternal Aunt    Diabetes Maternal Uncle    Diabetes Maternal Grandmother    Colon cancer Neg Hx    Colon polyps Neg Hx    Esophageal cancer Neg Hx    Pancreatic cancer Neg Hx    Stomach cancer Neg Hx     Past Surgical History:  Procedure Laterality Date   IR PARACENTESIS  07/21/2021   NO PAST SURGERIES      ROS: Review of Systems Negative except as stated above  PHYSICAL EXAM: BP 114/77 (BP Location: Left Arm, Patient Position: Sitting, Cuff Size: Normal)   Pulse 90   Temp 98.4 F (36.9 C) (Oral)   Ht '5\' 6"'$  (1.676 m)   Wt 161 lb (73 kg)   SpO2 96%   BMI 25.99 kg/m   Physical Exam  General appearance - alert, well appearing, middle-age Caucasian male and in no distress.  He looks completely different compared to when I last saw him in March. Mental status - normal mood, behavior, speech, dress, motor activity, and thought processes Chest - clear to auscultation, no wheezes, rales or rhonchi, symmetric air entry Heart - normal rate, regular rhythm, normal S1, S2, no murmurs, rubs, clicks or gallops Abdomen - soft, nontender, nondistended, no masses or organomegaly      Latest Ref Rng & Units 11/18/2021   11:49 AM 09/26/2021    2:34 PM 08/01/2021    3:12 PM  CMP  Glucose 70 - 99 mg/dL 117     BUN 6 - 23 mg/dL 13     Creatinine 0.40 - 1.50 mg/dL 0.65     Sodium 135 - 145 mEq/L 135     Potassium 3.5 - 5.1 mEq/L 4.3     Chloride 96 - 112 mEq/L 106     CO2 19 - 32 mEq/L 21     Calcium 8.4 - 10.5 mg/dL 10.2     Total Protein 6.0 - 8.5 g/dL  7.2  7.4   Total Bilirubin 0.0 - 1.2 mg/dL  1.4  8.7   Alkaline Phos 44 - 121 IU/L  164  365   AST 0 - 40 IU/L  31  74   ALT 0 - 44 IU/L  14  27    Lipid Panel  No  results found for: "CHOL", "TRIG", "HDL", "CHOLHDL", "  VLDL", "LDLCALC", "LDLDIRECT"  CBC    Component Value Date/Time   WBC 7.6 09/26/2021 1434   WBC 12.3 (H) 08/01/2021 1512   RBC 3.40 (L) 09/26/2021 1434   RBC 3.53 (L) 08/01/2021 1512   HGB 10.6 (L) 09/26/2021 1434   HCT 30.3 (L) 09/26/2021 1434   PLT 126 (L) 09/26/2021 1434   MCV 89 09/26/2021 1434   MCH 31.2 09/26/2021 1434   MCH 33.8 06/15/2021 0219   MCHC 35.0 09/26/2021 1434   MCHC 34.4 08/01/2021 1512   RDW 12.8 09/26/2021 1434   LYMPHSABS 1.7 09/26/2021 1434   MONOABS 1.1 (H) 08/01/2021 1512   EOSABS 0.3 09/26/2021 1434   BASOSABS 0.1 09/26/2021 1434    ASSESSMENT AND PLAN:  1. Alcoholic cirrhosis of liver without ascites ([place]) Commended him on abstaining from alcohol and encouraged him to continue to do so. He is doing well on current dose of furosemide and Aldactone. 2. Macrocytic anemia We will recheck CBC to make sure this is stabilized improved. - CBC  3. Alcohol use disorder, severe, in sustained remission ([place]) See #1 above  4. Need for immunization against influenza - Flu Vaccine QUAD 20moIM (Fluarix, Fluzone & Alfiuria Quad PF)   Patient was given the opportunity to ask questions.  Patient verbalized understanding of the plan and was able to repeat key elements of the plan.   This documentation was completed using DRadio producer  Any transcriptional errors are unintentional.  Orders Placed This Encounter  Procedures   Flu Vaccine QUAD 653moM (Fluarix, Fluzone & Alfiuria Quad PF)   CBC     Requested Prescriptions    No prescriptions requested or ordered in this encounter    Return in about 6 months (around 07/25/2022).  DeKarle PlumberMD, FACP

## 2022-01-30 ENCOUNTER — Ambulatory Visit: Payer: 59 | Attending: Internal Medicine

## 2022-01-30 ENCOUNTER — Ambulatory Visit (HOSPITAL_COMMUNITY)
Admission: RE | Admit: 2022-01-30 | Discharge: 2022-01-30 | Disposition: A | Discharge status: Home / Self Care | Payer: 59 | Source: Ambulatory Visit | Attending: Gastroenterology | Admitting: Gastroenterology

## 2022-01-30 DIAGNOSIS — R69 Illness, unspecified: Secondary | ICD-10-CM | POA: Diagnosis not present

## 2022-01-30 DIAGNOSIS — D649 Anemia, unspecified: Secondary | ICD-10-CM | POA: Insufficient documentation

## 2022-01-30 DIAGNOSIS — K7030 Alcoholic cirrhosis of liver without ascites: Secondary | ICD-10-CM | POA: Diagnosis present

## 2022-01-30 DIAGNOSIS — I8500 Esophageal varices without bleeding: Secondary | ICD-10-CM | POA: Insufficient documentation

## 2022-01-30 DIAGNOSIS — K7460 Unspecified cirrhosis of liver: Secondary | ICD-10-CM | POA: Diagnosis not present

## 2022-01-30 DIAGNOSIS — D539 Nutritional anemia, unspecified: Secondary | ICD-10-CM | POA: Diagnosis not present

## 2022-01-31 LAB — CBC
Hematocrit: 35.8 % — ABNORMAL LOW (ref 37.5–51.0)
Hemoglobin: 12.7 g/dL — ABNORMAL LOW (ref 13.0–17.7)
MCH: 29.5 pg (ref 26.6–33.0)
MCHC: 35.5 g/dL (ref 31.5–35.7)
MCV: 83 fL (ref 79–97)
Platelets: 134 10*3/uL — ABNORMAL LOW (ref 150–450)
RBC: 4.3 x10E6/uL (ref 4.14–5.80)
RDW: 12.9 % (ref 11.6–15.4)
WBC: 11 10*3/uL — ABNORMAL HIGH (ref 3.4–10.8)

## 2022-02-01 ENCOUNTER — Other Ambulatory Visit: Payer: Self-pay | Admitting: Internal Medicine

## 2022-02-01 DIAGNOSIS — D649 Anemia, unspecified: Secondary | ICD-10-CM

## 2022-02-12 ENCOUNTER — Other Ambulatory Visit: Payer: Self-pay | Admitting: Nurse Practitioner

## 2022-05-17 ENCOUNTER — Encounter: Payer: Self-pay | Admitting: Gastroenterology

## 2022-06-03 DIAGNOSIS — M25561 Pain in right knee: Secondary | ICD-10-CM | POA: Diagnosis not present

## 2022-07-19 ENCOUNTER — Telehealth: Payer: Self-pay

## 2022-07-19 DIAGNOSIS — I8500 Esophageal varices without bleeding: Secondary | ICD-10-CM

## 2022-07-19 DIAGNOSIS — K7030 Alcoholic cirrhosis of liver without ascites: Secondary | ICD-10-CM

## 2022-07-19 NOTE — Telephone Encounter (Signed)
-----   Message from Roetta Sessions, [place] sent at 02/09/2022 12:38 PM EDT ----- Regarding: due for [facility] screening in april Patient due for repeat RUQ U/S for hcc screening in 07-2022. Last was 01-30-2022

## 2022-07-19 NOTE — Telephone Encounter (Signed)
Order placed for RUQ in April.  Scheduled patient for Wed, April 10th at 9:00am, to arr at 8:30am NPO after midnight. MyChart message sent to pt

## 2022-07-25 ENCOUNTER — Ambulatory Visit: Payer: 59 | Attending: Internal Medicine | Admitting: Internal Medicine

## 2022-07-25 VITALS — BP 106/61 | HR 77 | Temp 98.6°F | Ht 64.0 in | Wt 148.0 lb

## 2022-07-25 DIAGNOSIS — K7030 Alcoholic cirrhosis of liver without ascites: Secondary | ICD-10-CM

## 2022-07-25 DIAGNOSIS — D649 Anemia, unspecified: Secondary | ICD-10-CM | POA: Diagnosis not present

## 2022-07-25 DIAGNOSIS — F17200 Nicotine dependence, unspecified, uncomplicated: Secondary | ICD-10-CM

## 2022-07-25 DIAGNOSIS — F1091 Alcohol use, unspecified, in remission: Secondary | ICD-10-CM

## 2022-07-25 MED ORDER — CARVEDILOL 3.125 MG PO TABS
3.1250 mg | ORAL_TABLET | Freq: Two times a day (BID) | ORAL | 6 refills | Status: AC
Start: 2022-07-25 — End: ?

## 2022-07-25 MED ORDER — SPIRONOLACTONE 100 MG PO TABS: 100.0000 mg | ORAL_TABLET | Freq: Every day | ORAL | 4 refills | Status: DC

## 2022-07-25 NOTE — Progress Notes (Signed)
Denies concerns to address today   Has future orders from 01/2022 for B12 and Iron level

## 2022-07-25 NOTE — Patient Instructions (Signed)
Stop furosemide. Return to the lab sometime this week to have your blood test done including liver function tests, kidney function and recheck for anemia.

## 2022-07-25 NOTE — Progress Notes (Signed)
Patient ID: Thomas Snyder, male    DOB: 1981/01/08  MRN: [id number]  CC: Chronic disease management  Subjective: Thomas Snyder is a 42 y.o. male who presents for chronic ds management His concerns today include:  Pt with hx of ETOH cirrhosis with ascites, ETOH UD, tob dep, prolong QT   Patient was last seen 6 months ago.  He has remained free of alcohol.  He continues to take carvedilol, furosemide and spironolactone.  Admits that he sometimes skips taking the spironolactone and/or furosemide several days a week.  He has not had any lower extremity edema.  No increased abdominal girth to suggest ascites.  He has liver ultrasound scheduled for the 10th of this month. Labs ordered on last visit included B12 and iron levels because of anemia.  His hemoglobin had improved from 10.6 to 12.7. He continues to smoke about half a pack of cigarettes a day.  Not totally ready to quit but working on cutting back. Patient Active Problem List   Diagnosis Date Noted   Alcohol use disorder, severe, in early remission 07/23/2021   Macrocytic anemia 07/23/2021   Acute liver failure 06/11/2021   Prolonged QT interval 06/11/2021   Hypokalemia 06/11/2021   Alcohol dependence 06/11/2021   Tobacco dependence 06/11/2021   Fever [id number]   Alcoholic hepatitis with ascites    Alcoholic cirrhosis of liver with ascites    Jaundice      Current Outpatient Medications on File Prior to Visit  Medication Sig Dispense Refill   carvedilol (COREG) 3.125 MG tablet Take 1 tablet (3.125 mg total) by mouth 2 (two) times daily with a meal. 60 tablet 6   folic acid (FOLVITE) 1 MG tablet Take 1 tablet (1 mg total) by mouth daily. 90 tablet 1   furosemide (LASIX) 40 MG tablet Take 1 tablet (40 mg total) by mouth daily. 30 tablet 3   Magnesium Hydroxide (DULCOLAX SOFT CHEWS PO) Take 1 tablet by mouth as needed.     Multiple Vitamin (MULTIVITAMIN) tablet Take 1 tablet by mouth daily.     pantoprazole (PROTONIX) 40  MG tablet TAKE 1 TABLET BY MOUTH EVERY MORNING 90 tablet 3   spironolactone (ALDACTONE) 100 MG tablet Take 1 tablet (100 mg total) by mouth daily. 30 tablet 4   No current facility-administered medications on file prior to visit.    Allergies  Allergen Reactions   Milk-Related Compounds    Penicillins    Shellfish Allergy     Other reaction(s): Other (See Comments) Rash     Social History   Socioeconomic History   Marital status: Single    Spouse name: Not on file   Number of children: Not on file   Years of education: Not on file   Highest education level: GED or equivalent  Occupational History   Not on file  Tobacco Use   Smoking status: Every Day    Packs/day: .5    Types: Cigarettes   Smokeless tobacco: Never  Vaping Use   Vaping Use: Former  Substance and Sexual Activity   Alcohol use: Not Currently    Alcohol/week: 42.0 standard drinks of alcohol    Types: 7 Cans of beer, 35 Shots of liquor per week    Comment: drinks every day, at least 1 beer and 5 shots of liquor a day with other days he binges   Drug use: Yes    Types: Marijuana   Sexual activity: Not on file  Other Topics Concern   Not  on file  Social History Narrative   Not on file   Social Determinants of Health   Financial Resource Strain: Medium Risk (07/25/2022)   Overall Financial Resource Strain (CARDIA)    Difficulty of Paying Living Expenses: Somewhat hard  Food Insecurity: Patient Declined (07/25/2022)   Hunger Vital Sign    Worried About Running Out of Food in the Last Year: Patient declined    [place] in the Last Year: Patient declined  Transportation Needs: Patient Declined (07/25/2022)   PRAPARE - Hydrologist (Medical): Patient declined    Lack of Transportation (Non-Medical): Patient declined  Physical Activity: Sufficiently Active (07/25/2022)   Exercise Vital Sign    Days of Exercise per Week: 4 days    Minutes of Exercise per Session: 90 min   Stress: No Stress Concern Present (07/25/2022)   [place]    Feeling of Stress : Not at all  Social Connections: Socially Isolated (07/25/2022)   Social Connection and Isolation Panel [NHANES]    Frequency of Communication with Friends and Family: More than three times a week    Frequency of Social Gatherings with Friends and Family: Once a week    Attends Religious Services: Never    Marine scientist or Organizations: No    Attends Music therapist: Not on file    Marital Status: Never married  Human resources officer Violence: Not on file    Family History  Problem Relation Age of Onset   Diabetes Mother    Diabetes Father    Diabetes Maternal Aunt    Diabetes Maternal Uncle    Diabetes Maternal Grandmother    Colon cancer Neg Hx    Colon polyps Neg Hx    Esophageal cancer Neg Hx    Pancreatic cancer Neg Hx    Stomach cancer Neg Hx     Past Surgical History:  Procedure Laterality Date   IR PARACENTESIS  07/21/2021   NO PAST SURGERIES      ROS: Review of Systems Negative except as stated above  PHYSICAL EXAM: BP 106/61 (BP Location: Right Arm, Patient Position: Sitting, Cuff Size: Normal)   Pulse 77   Temp 98.6 F (37 C) (Oral)   Ht 5\' 4"  (1.626 m)   Wt 148 lb (67.1 kg)   SpO2 97%   BMI 25.40 kg/m   Physical Exam   General appearance - alert, well appearing, middle-age Caucasian male and in no distress Mental status - normal mood, behavior, speech, dress, motor activity, and thought processes Chest - clear to auscultation, no wheezes, rales or rhonchi, symmetric air entry Heart - normal rate, regular rhythm, normal S1, S2, no murmurs, rubs, clicks or gallops Abdomen - soft, nontender, nondistended, no masses or organomegaly Extremities - peripheral pulses normal, no pedal edema, no clubbing or cyanosis     Latest Ref Rng & Units 11/18/2021   11:49 AM 09/26/2021    2:34 PM 08/01/2021     3:12 PM  CMP  Glucose 70 - 99 mg/dL 117     BUN 6 - 23 mg/dL 13     Creatinine 0.40 - 1.50 mg/dL 0.65     Sodium 135 - 145 mEq/L 135     Potassium 3.5 - 5.1 mEq/L 4.3     Chloride 96 - 112 mEq/L 106     CO2 19 - 32 mEq/L 21     Calcium 8.4 - 10.5  mg/dL 10.2     Total Protein 6.0 - 8.5 g/dL  7.2  7.4   Total Bilirubin 0.0 - 1.2 mg/dL  1.4  8.7   Alkaline Phos 44 - 121 IU/L  164  365   AST 0 - 40 IU/L  31  74   ALT 0 - 44 IU/L  14  27    Lipid Panel  No results found for: "CHOL", "TRIG", "HDL", "CHOLHDL", "VLDL", "LDLCALC", "LDLDIRECT"  CBC    Component Value Date/Time   WBC 11.0 (H) 01/30/2022 1417   WBC 12.3 (H) 08/01/2021 1512   RBC 4.30 01/30/2022 1417   RBC 3.53 (L) 08/01/2021 1512   HGB 12.7 (L) 01/30/2022 1417   HCT 35.8 (L) 01/30/2022 1417   PLT 134 (L) 01/30/2022 1417   MCV 83 01/30/2022 1417   MCH 29.5 01/30/2022 1417   MCH 33.8 06/15/2021 0219   MCHC 35.5 01/30/2022 1417   MCHC 34.4 08/01/2021 1512   RDW 12.9 01/30/2022 1417   LYMPHSABS 1.7 09/26/2021 1434   MONOABS 1.1 (H) 08/01/2021 1512   EOSABS 0.3 09/26/2021 1434   BASOSABS 0.1 09/26/2021 1434    ASSESSMENT AND PLAN:  1. Alcoholic cirrhosis of liver without ascites Compensated.  Commended him on remaining free of alcohol.  And encouraged him to continue to abstain.  Given that he is doing so well with no recurrence of edema or ascites, we will have him stop furosemide and continue the spironolactone.  Restart furosemide if he develops any edema in the legs or signs of ascites. - Comprehensive metabolic panel; Future  2. Normocytic anemia - CBC; Future - Iron, TIBC and Ferritin Panel; Future - Vitamin B12; Future  3. Tobacco dependence Encouraged him to quit.  4. Alcohol use disorder in remission Commended him on remaining alcohol free.    Patient was given the opportunity to ask questions.  Patient verbalized understanding of the plan and was able to repeat key elements of the plan.    This documentation was completed using Radio producer.  Any transcriptional errors are unintentional.  No orders of the defined types were placed in this encounter.    Requested Prescriptions    No prescriptions requested or ordered in this encounter    No follow-ups on file.  Karle Plumber, MD, FACP

## 2022-08-02 ENCOUNTER — Ambulatory Visit (HOSPITAL_COMMUNITY)
Admission: RE | Admit: 2022-08-02 | Discharge: 2022-08-02 | Disposition: A | Discharge status: Home / Self Care | Payer: 59 | Source: Ambulatory Visit | Attending: Gastroenterology | Admitting: Gastroenterology

## 2022-08-02 ENCOUNTER — Encounter: Payer: Self-pay | Admitting: Gastroenterology

## 2022-08-02 ENCOUNTER — Ambulatory Visit: Payer: 59 | Attending: Internal Medicine

## 2022-08-02 DIAGNOSIS — I8500 Esophageal varices without bleeding: Secondary | ICD-10-CM | POA: Diagnosis not present

## 2022-08-02 DIAGNOSIS — K7030 Alcoholic cirrhosis of liver without ascites: Secondary | ICD-10-CM | POA: Diagnosis not present

## 2022-08-02 DIAGNOSIS — K7460 Unspecified cirrhosis of liver: Secondary | ICD-10-CM | POA: Diagnosis not present

## 2022-08-02 DIAGNOSIS — D649 Anemia, unspecified: Secondary | ICD-10-CM

## 2022-08-03 ENCOUNTER — Other Ambulatory Visit: Payer: Self-pay

## 2022-08-03 DIAGNOSIS — K7030 Alcoholic cirrhosis of liver without ascites: Secondary | ICD-10-CM

## 2022-08-03 LAB — COMPREHENSIVE METABOLIC PANEL
ALT: 24 IU/L (ref 0–44)
AST: 31 IU/L (ref 0–40)
Albumin/Globulin Ratio: 1.8 (ref 1.2–2.2)
Albumin: 4.7 g/dL (ref 4.1–5.1)
Alkaline Phosphatase: 240 IU/L — ABNORMAL HIGH (ref 44–121)
BUN/Creatinine Ratio: 15 (ref 9–20)
BUN: 10 mg/dL (ref 6–20)
Bilirubin Total: 1.3 mg/dL — ABNORMAL HIGH (ref 0.0–1.2)
CO2: 22 mmol/L (ref 20–29)
Calcium: 9.8 mg/dL (ref 8.7–10.2)
Chloride: 103 mmol/L (ref 96–106)
Creatinine, Ser: 0.68 mg/dL — ABNORMAL LOW (ref 0.76–1.27)
Globulin, Total: 2.6 g/dL (ref 1.5–4.5)
Glucose: 97 mg/dL (ref 70–99)
Potassium: 4.8 mmol/L (ref 3.5–5.2)
Sodium: 138 mmol/L (ref 134–144)
Total Protein: 7.3 g/dL (ref 6.0–8.5)
eGFR: 121 mL/min/{1.73_m2} (ref >59)

## 2022-08-03 LAB — IRON,TIBC AND FERRITIN PANEL
Ferritin: 92 ng/mL (ref 30–400)
Iron Saturation: 30 % (ref 15–55)
Iron: 120 ug/dL (ref 38–169)
Total Iron Binding Capacity: 396 ug/dL (ref 250–450)
UIBC: 276 ug/dL (ref 111–343)

## 2022-08-03 LAB — CBC
Hematocrit: 38.1 % (ref 37.5–51.0)
Hemoglobin: 13.4 g/dL (ref 13.0–17.7)
MCH: 29.6 pg (ref 26.6–33.0)
MCHC: 35.2 g/dL (ref 31.5–35.7)
MCV: 84 fL (ref 79–97)
Platelets: 152 10*3/uL (ref 150–450)
RBC: 4.52 x10E6/uL (ref 4.14–5.80)
RDW: 12.7 % (ref 11.6–15.4)
WBC: 10.6 10*3/uL (ref 3.4–10.8)

## 2022-08-03 LAB — VITAMIN B12: Vitamin B-12: 1048 pg/mL (ref 232–1245)

## 2022-08-03 NOTE — Progress Notes (Signed)
Labs due

## 2022-10-11 ENCOUNTER — Other Ambulatory Visit (INDEPENDENT_AMBULATORY_CARE_PROVIDER_SITE_OTHER): Payer: 59

## 2022-10-11 ENCOUNTER — Ambulatory Visit: Payer: 59 | Admitting: Gastroenterology

## 2022-10-11 ENCOUNTER — Encounter: Payer: Self-pay | Admitting: Gastroenterology

## 2022-10-11 VITALS — BP 98/52 | HR 70 | Ht 65.0 in | Wt 143.8 lb

## 2022-10-11 DIAGNOSIS — K219 Gastro-esophageal reflux disease without esophagitis: Secondary | ICD-10-CM

## 2022-10-11 DIAGNOSIS — K7030 Alcoholic cirrhosis of liver without ascites: Secondary | ICD-10-CM

## 2022-10-11 DIAGNOSIS — I8500 Esophageal varices without bleeding: Secondary | ICD-10-CM

## 2022-10-11 LAB — IBC + FERRITIN
Ferritin: 28.9 ng/mL (ref 22.0–322.0)
Iron: 63 ug/dL (ref 42–165)
Saturation Ratios: 13.6 % — ABNORMAL LOW (ref 20.0–50.0)
TIBC: 463.4 ug/dL — ABNORMAL HIGH (ref 250.0–450.0)
Transferrin: 331 mg/dL (ref 212.0–360.0)

## 2022-10-11 MED ORDER — PANTOPRAZOLE SODIUM 20 MG PO TBEC: 20.0000 mg | DELAYED_RELEASE_TABLET | Freq: Every day | ORAL | 3 refills | Status: AC

## 2022-10-11 NOTE — Progress Notes (Signed)
HPI :  42 year old male here for a follow-up visit for alcoholic cirrhosis. Last seen 10/2021   Recall he dx February 2023 when he presented with alcoholic hepatitis with a bilirubin of 24.5, elevations in his transaminases with leukocytosis.  Imaging with CT scan showed changes of cirrhosis with portal hypertension and varices.  He had mild ascites at the time.  Discriminant function met criteria for steroids at the time and prednisone was started.  Over his course of it it did not seem to improve his numbers significantly within the first few weeks it was discontinued. He had a full serologic work-up which showed no cause of cirrhosis otherwise.  His ferritin was elevated thought to be acute inflammatory reactant, he tested negative for genetic testing for hemochromatosis.  He previously drank at least 3 beers per day and 5 shots of hard liquor at night.  This was ongoing for longstanding time.  He has been complete alcohol free since the hospitalization in February 2023.  Otherwise has been vaccinated hepatitis A and B. He does have a history of esophageal varices on Coreg.  He has been doing really well since have last seen him.  Continues to be completely abstinent from alcohol.  He has not had any decompensations.  He is compliant with his Coreg.  He previously was on Lasix and Aldactone for edema.  He states he had the Lasix stopped about 3 months ago and has not noticed any difference since coming off of it.  He denies any lower extremity edema or swelling in his abdomen.  He does have a varicosity in his periumbilical area which has been stable over time and it does not hurt him.  He had a right upper quadrant ultrasound in April for [facility] screening which was negative for hepatomas, stable changes of cirrhosis.  His EGD was in April 2023 showing varices, he has been on Coreg since that time and tolerates it okay.  Otherwise has a history of GERD, previously controlled on Protonix 40 mg once daily.  He  states he ran out of this and has been using omeprazole over-the-counter at home and has been working fairly well for him.  He denies any breakthrough symptoms.  Prior EGD showed no Barrett's.  He has no complaints today.  CBC, CMET done in April.  He has not had an INR in some time.  Due for AFP as well.   Prior workup: [place] 06/11/21: IMPRESSION: 1. Gallbladder wall thickening in the absence of cholelithiasis. 2. Hepatic steatosis and hepatomegaly with additional findings suggestive of hepatic cirrhosis. 3. No flow within the main portal vein which may represent sequelae associated with extensive portal hypertension. Correlation with contrast-enhanced abdomen pelvis CT is recommended, as portal vein thrombosis cannot be excluded. 4. Perihepatic ascites which may represent the source of the previously noted gallbladder wall thickening.   CT abdomen / pelvis 06/11/21: IMPRESSION: 1. Morphologic features of the liver compatible with cirrhosis. There is a hepatic steatosis and marked hepatomegaly. Confluent fibrosis of the medial segment of left hepatic lobe noted. 2. Stigmata of portal venous hypertension with recanalized umbilical vein, small esophageal and gastric varices, and small volume of ascites. 3. Mild circumferential bowel wall edema involving the proximal colon with marked edema involving the mid and distal sigmoid colon and rectum. Findings are nonspecific and may reflect underlying colitis versus hepatic colopathy 4. Aortic Atherosclerosis (ICD10-I70.0).   [place] 06/12/21: IMPRESSION: Trace perihepatic ascites. Paracentesis not performed due to the small amount of fluid.  EGD 08/17/21: - A 1 cm hiatal hernia was present. - Varices were found in the lower third of the esophagus. They were small to medium in size without any high risk stigmata for bleeding. - The exam of the esophagus was otherwise normal. 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 Biopsies were taken with a cold forceps for Helicobacter pylori testing. - The exam of the stomach was otherwise normal. No gastric varices. - A single diminutive sessile polyp was found in the second portion of the duodenum. The polyp was removed with a cold biopsy forceps. Resection and retrieval were complete. - The exam of the duodenum was otherwise normal.   Colonoscopy 08/17/21: The perianal and digital rectal examinations were normal. - Multiple small-mouthed diverticula were found in the sigmoid colon with superficial erythema in the sigmoid colon. - A 3 mm polyp was found in the recto-sigmoid colon. The polyp was sessile. The polyp was removed with a cold snare. Resection and retrieval were complete. - Internal hemorrhoids were found during retroflexion. The hemorrhoids were small. - The exam was otherwise without abnormality.   1. Surgical [P], duodenal polyp, polyp (1) POLYPOID FRAGMENTS OF DUODENAL MUCOSA. THERE ARE NO DIAGNOSTIC FEATURES OF CELIAC DISEASE. NEGATIVE FOR ADENOMATOUS CHANGE AND NEOPLASM. 2. Surgical [P], gastric antrum and gastric body FRAGMENTS OF GASTRIC MUCOSA WITH NO SIGNIFICANT MICROSCOPIC ABNORMALITIES. H. PYLORI, INTESTINAL METAPLASIA, ATROPHY AND DYSPLASIA ARE NOT IDENTIFIED. 3. Surgical [P], colon, recto-sigmoid, polyp (1) HYPERPLASTIC POLYP. NEGATIVE FOR DYSPLASIA.   RUQ [place] 08/02/22: IMPRESSION: 1. Cirrhotic morphology of the liver. No focal lesion identified. 2. Recannulated periumbilical vein.  Past Medical History:  Diagnosis Date   Alcohol dependence (HCC)    Cirrhosis, alcoholic (HCC)    Esophageal varices (HCC)    Tobacco dependence      Past Surgical History:  Procedure Laterality Date   IR PARACENTESIS  07/21/2021   NO PAST SURGERIES     Family History  Problem Relation Age of Onset   Diabetes Mother    Diabetes Father    Diabetes Maternal Aunt    Diabetes Maternal Uncle    Diabetes Maternal Grandmother     Colon cancer Neg Hx    Colon polyps Neg Hx    Esophageal cancer Neg Hx    Pancreatic cancer Neg Hx    Stomach cancer Neg Hx    Social History   Tobacco Use   Smoking status: Every Day    Packs/day: .5    Types: Cigarettes   Smokeless tobacco: Never  Vaping Use   Vaping Use: Former  Substance Use Topics   Alcohol use: Not Currently    Alcohol/week: 42.0 standard drinks of alcohol    Types: 7 Cans of beer, 35 Shots of liquor per week    Comment: drinks every day, at least 1 beer and 5 shots of liquor a day with other days he binges   Drug use: Yes    Types: Marijuana   Current Outpatient Medications  Medication Sig Dispense Refill   carvedilol (COREG) 3.125 MG tablet Take 1 tablet (3.125 mg total) by mouth 2 (two) times daily with a meal. 60 tablet 6   folic acid (FOLVITE) 1 MG tablet Take 1 tablet (1 mg total) by mouth daily. 90 tablet 1   Magnesium Hydroxide (DULCOLAX SOFT CHEWS PO) Take 1 tablet by mouth as needed.     Multiple Vitamin (MULTIVITAMIN) tablet Take 1 tablet by mouth daily.     pantoprazole (  PROTONIX) 40 MG tablet TAKE 1 TABLET BY MOUTH EVERY MORNING 90 tablet 3   spironolactone (ALDACTONE) 100 MG tablet Take 1 tablet (100 mg total) by mouth daily. 30 tablet 4   No current facility-administered medications for this visit.   Allergies  Allergen Reactions   Milk-Related Compounds    Penicillins    Shellfish Allergy     Other reaction(s): Other (See Comments) Rash      Review of Systems: All systems reviewed and negative except where noted in HPI.   Lab Results  Component Value Date   WBC 10.6 08/02/2022   HGB 13.4 08/02/2022   HCT 38.1 08/02/2022   MCV 84 08/02/2022   PLT 152 08/02/2022    Lab Results  Component Value Date   CREATININE 0.68 (L) 08/02/2022   BUN 10 08/02/2022   NA 138 08/02/2022   K 4.8 08/02/2022   CL 103 08/02/2022   CO2 22 08/02/2022    Lab Results  Component Value Date   INR 1.1 09/26/2021   INR 1.4 (H) 08/01/2021    INR 1.2 07/18/2021    Lab Results  Component Value Date   ALT 24 08/02/2022   AST 31 08/02/2022   ALKPHOS 240 (H) 08/02/2022   BILITOT 1.3 (H) 08/02/2022   Computed MELD 3.0 unavailable. Necessary lab results were not found in the last year. Computed MELD-Na unavailable. Necessary lab results were not found in the last year.    Physical Exam: BP (!) 98/52   Pulse 70   Ht 5\' 5"  (1.651 m)   Wt 143 lb 12.8 oz (65.2 kg)   BMI 23.93 kg/m  Constitutional: Pleasant,well-developed, male in no acute distress. Abdominal: Soft, nondistended, nontender. Periumbilical varix noted.  Extremities: no edema Neurological: Alert and oriented to person place and time. Psychiatric: Normal mood and affect. Behavior is normal.   ASSESSMENT: 42 y.o. male here for assessment of the following  1. Alcoholic cirrhosis, unspecified whether ascites present (HCC)   2. Esophageal varices without bleeding, unspecified esophageal varices type (HCC)   3. Gastroesophageal reflux disease, unspecified whether esophagitis present    As above presented with acute alcoholic hepatitis on top of decompensated cirrhosis during hospitalization in February 2023.  He has been completely abstinent from alcohol since that time and doing really well.  He does have a history of varices but no bleeding, compliant with Coreg.  He has a history of ascites and lower extremity edema, previously on Lasix and Aldactone, has been able to come off Lasix without any recurrent edema.  We discussed options, he can try stopping the Aldactone and see how things go.  If he notices recurrence of edema/fluid he can resume it.  He wants to try coming off of it and would stay on a low-sodium diet.  He is due for an AFP and INR today to upgrade his MELD score and for [facility] screening his right upper quadrant ultrasound is up-to-date, will repeat that again in 6 months.  Discussed cirrhosis in general, risks for decompensation and HCC over time, he  understands.  Critically important that he maintain complete abstinence from alcohol and he is agreeable to do this.  Will continue to see him every 6 months  PLAN: - doing a  great job with alcohol abstinence, continue - stop aldactone - see if he needs this long term, if recurrent swelling / ascites recurs then resume - stay on low Na diet - continue Coreg - lab today for AFP and INR -  will need repeat labs - CBC, CMET, INR, AFP in December - recall abdominal US in December - refill protonix but at lower dose, 20mg  / day. Can increase to 40mg  if needed. counseled on long term use / risks of PPI - f/u 6 months or sooner with issues  Harlin Rain, MD [facility]

## 2022-10-11 NOTE — Patient Instructions (Signed)
If your blood pressure at your visit was 140/90 or greater, please contact your primary care physician to follow up on this. ______________________________________________________  If you are age 40 or older, your body mass index should be between 23-30. Your Body mass index is 23.93 kg/m. If this is out of the aforementioned range listed, please consider follow up with your Primary Care Provider.  If you are age 45 or younger, your body mass index should be between 19-25. Your Body mass index is 23.93 kg/m. If this is out of the aformentioned range listed, please consider follow up with your Primary Care Provider.  ________________________________________________________  The [facility] providers would like to encourage you to use [facility] to communicate with providers for non-urgent requests or questions.  Due to long hold times on the telephone, sending your provider a message by [facility] may be a faster and more efficient way to get a response.  Please allow 48 business hours for a response.  Please remember that this is for non-urgent requests.  _______________________________________________________  Due to recent changes in healthcare laws, you may see the results of your imaging and laboratory studies on MyChart before your provider has had a chance to review them.  We understand that in some cases there may be results that are confusing or concerning to you. Not all laboratory results come back in the same time frame and the provider may be waiting for multiple results in order to interpret others.  Please give [place] 48 hours in order for your provider to thoroughly review all the results before contacting the office for clarification of your results.   Stop Aldactone  Follow a low sodium diet.  Please go to the lab in the basement of our building to have lab work done as you leave today. Hit "B" for basement when you get on the elevator.  When the doors open the lab is on your left.  We will  call you with the results. Thank you.  You will be due for labs and an Ultrasound in December.  We have sent the following medications to your pharmacy for you to pick up at your convenience: Protonix 20 mg: Take once daily  Please follow up in 6 months.  Thank you for entrusting me with your care and for choosing [facility], Dr. Ileene Patrick

## 2022-10-12 ENCOUNTER — Other Ambulatory Visit: Payer: Self-pay

## 2022-10-12 DIAGNOSIS — K219 Gastro-esophageal reflux disease without esophagitis: Secondary | ICD-10-CM

## 2022-10-12 DIAGNOSIS — D649 Anemia, unspecified: Secondary | ICD-10-CM

## 2022-10-12 DIAGNOSIS — I8500 Esophageal varices without bleeding: Secondary | ICD-10-CM

## 2022-10-12 LAB — AFP TUMOR MARKER: AFP-Tumor Marker: 2 ng/mL (ref <6.1)

## 2022-11-27 NOTE — Telephone Encounter (Signed)
Letter mailed to patient with appointment information 

## 2022-12-21 ENCOUNTER — Other Ambulatory Visit: Payer: Self-pay

## 2022-12-21 ENCOUNTER — Other Ambulatory Visit (INDEPENDENT_AMBULATORY_CARE_PROVIDER_SITE_OTHER): Payer: 59

## 2022-12-21 DIAGNOSIS — D649 Anemia, unspecified: Secondary | ICD-10-CM

## 2022-12-21 DIAGNOSIS — K219 Gastro-esophageal reflux disease without esophagitis: Secondary | ICD-10-CM | POA: Diagnosis not present

## 2022-12-21 DIAGNOSIS — I8500 Esophageal varices without bleeding: Secondary | ICD-10-CM | POA: Diagnosis not present

## 2022-12-21 DIAGNOSIS — K7030 Alcoholic cirrhosis of liver without ascites: Secondary | ICD-10-CM

## 2022-12-21 LAB — PROTIME-INR
INR: 1.3 ratio — ABNORMAL HIGH (ref 0.8–1.0)
Prothrombin Time: 13.5 s — ABNORMAL HIGH (ref 9.6–13.1)

## 2023-01-14 ENCOUNTER — Encounter: Payer: Self-pay | Admitting: Gastroenterology

## 2023-01-22 ENCOUNTER — Telehealth: Payer: Self-pay

## 2023-01-22 DIAGNOSIS — K7030 Alcoholic cirrhosis of liver without ascites: Secondary | ICD-10-CM

## 2023-01-22 NOTE — Telephone Encounter (Signed)
Order placed for RUQ U/S due in mid October.  MyChart message to patient that he is due and to expect a call. Message to schedulers to call patient

## 2023-01-22 NOTE — Telephone Encounter (Signed)
-----   Message from [facility] sent at 08/03/2022  8:43 AM EDT ----- Regarding: RUQ U/S Patient due for RUQ U/S - (cirrhosis) in mid Oct

## 2023-01-25 ENCOUNTER — Encounter: Payer: Self-pay | Admitting: Internal Medicine

## 2023-01-25 ENCOUNTER — Ambulatory Visit: Payer: 59 | Attending: Internal Medicine | Admitting: Internal Medicine

## 2023-01-25 VITALS — BP 89/53 | HR 61 | Temp 97.7°F | Ht 65.0 in | Wt 137.0 lb

## 2023-01-25 DIAGNOSIS — N529 Male erectile dysfunction, unspecified: Secondary | ICD-10-CM

## 2023-01-25 DIAGNOSIS — K7030 Alcoholic cirrhosis of liver without ascites: Secondary | ICD-10-CM

## 2023-01-25 DIAGNOSIS — F1021 Alcohol dependence, in remission: Secondary | ICD-10-CM | POA: Diagnosis not present

## 2023-01-25 MED ORDER — SILDENAFIL CITRATE 50 MG PO TABS
ORAL_TABLET | ORAL | 3 refills | Status: AC
Start: 2023-01-25 — End: ?

## 2023-01-25 NOTE — Progress Notes (Signed)
Patient ID: Thomas Snyder, male    DOB: 1980/10/30  MRN: [id number]  CC: Cirrhosis (Alcoholic cirrhosis f/u. /No questions / concerns/Already received flu vax. )   Subjective: Thomas Snyder is a 42 y.o. male who presents for chronic ds management. His concerns today include:  Pt with hx of ETOH cirrhosis with resolved ascites, ETOH UD, tob dep, prolong QT   Discussed the use of AI scribe software for clinical note transcription with the patient, who gave verbal consent to proceed.  History of Present Illness   The patient, with a history of alcohol-induced cirrhosis, presents for a follow-up visit. He has been abstinent from alcohol for 21 months. He reports no new issues since the last visit six months ago; no swelling of the abdomen or lower extremity edema. He is currently on pantoprazole, carvedilol, and spironolactone.  Had seen the gastroenterologist Dr. Adela Lank in June.  AFP tumor marker was normal.  INR was 1.3.  Was advised that he should stop the Aldactone but restarted if he notices recurrence of edema.  Scheduled for repeat liver ultrasound in December. -Blood pressure today noted to be low.  Patient denies any dizziness.  In addition, the patient has recently started a new relationship and has been experiencing difficulty maintaining an erection x few mths.  A few years ago he had purchased sildenafil on-line at a site called [employer].  It provided some improvement.      Patient Active Problem List   Diagnosis Date Noted   Alcohol use disorder, severe, in early remission (HCC) 07/23/2021   Macrocytic anemia 07/23/2021   Acute liver failure 06/11/2021   Prolonged QT interval 06/11/2021   Hypokalemia 06/11/2021   Alcohol dependence (HCC) 06/11/2021   Tobacco dependence 06/11/2021   Fever 06/11/2021   Alcoholic hepatitis with ascites    Alcoholic cirrhosis of liver with ascites (HCC)    Jaundice      Current Outpatient Medications on File Prior to Visit   Medication Sig Dispense Refill   carvedilol (COREG) 3.125 MG tablet Take 1 tablet (3.125 mg total) by mouth 2 (two) times daily with a meal. 60 tablet 6   Magnesium Hydroxide (DULCOLAX SOFT CHEWS PO) Take 1 tablet by mouth as needed.     Multiple Vitamin (MULTIVITAMIN) tablet Take 1 tablet by mouth daily.     pantoprazole (PROTONIX) 20 MG tablet Take 1 tablet (20 mg total) by mouth daily. 90 tablet 3   folic acid (FOLVITE) 1 MG tablet Take 1 tablet (1 mg total) by mouth daily. (Patient not taking: Reported on 01/25/2023) 90 tablet 1   No current facility-administered medications on file prior to visit.    Allergies  Allergen Reactions   Milk-Related Compounds    Penicillins    Shellfish Allergy     Other reaction(s): Other (See Comments) Rash     Social History   Socioeconomic History   Marital status: Single    Spouse name: Not on file   Number of children: Not on file   Years of education: Not on file   Highest education level: GED or equivalent  Occupational History   Not on file  Tobacco Use   Smoking status: Every Day    Current packs/day: 0.50    Types: Cigarettes   Smokeless tobacco: Never  Vaping Use   Vaping status: Former  Substance and Sexual Activity   Alcohol use: Not Currently    Alcohol/week: 42.0 standard drinks of alcohol    Types: 7 Cans of  beer, 35 Shots of liquor per week    Comment: drinks every day, at least 1 beer and 5 shots of liquor a day with other days he binges   Drug use: Yes    Types: Marijuana   Sexual activity: Not on file  Other Topics Concern   Not on file  Social History Narrative   Not on file   Social Determinants of Health   Financial Resource Strain: Medium Risk (07/25/2022)   Overall Financial Resource Strain (CARDIA)    Difficulty of Paying Living Expenses: Somewhat hard  Food Insecurity: Patient Declined (07/25/2022)   Hunger Vital Sign    Worried About Running Out of Food in the Last Year: Patient declined    Ran Out of  Food in the Last Year: Patient declined  Transportation Needs: Patient Declined (07/25/2022)   PRAPARE - Administrator, Civil Service (Medical): Patient declined    Lack of Transportation (Non-Medical): Patient declined  Physical Activity: Sufficiently Active (07/25/2022)   Exercise Vital Sign    Days of Exercise per Week: 4 days    Minutes of Exercise per Session: 90 min  Stress: No Stress Concern Present (07/25/2022)   [employer] of Occupational Health - Occupational Stress Questionnaire    Feeling of Stress : Not at all  Social Connections: Socially Isolated (07/25/2022)   Social Connection and Isolation Panel [NHANES]    Frequency of Communication with Friends and Family: More than three times a week    Frequency of Social Gatherings with Friends and Family: Once a week    Attends Religious Services: Never    Database administrator or Organizations: No    Attends Engineer, structural: Not on file    Marital Status: Never married  Catering manager Violence: Not on file    Family History  Problem Relation Age of Onset   Diabetes Mother    Diabetes Father    Diabetes Maternal Aunt    Diabetes Maternal Uncle    Diabetes Maternal Grandmother    Colon cancer Neg Hx    Colon polyps Neg Hx    Esophageal cancer Neg Hx    Pancreatic cancer Neg Hx    Stomach cancer Neg Hx     Past Surgical History:  Procedure Laterality Date   IR PARACENTESIS  07/21/2021   NO PAST SURGERIES      ROS: Review of Systems Negative except as stated above  PHYSICAL EXAM: BP (!) 89/53 (BP Location: Left Arm, Patient Position: Sitting, Cuff Size: Normal)   Pulse 61   Temp 97.7 F (36.5 C) (Oral)   Ht 5\' 5"  (1.651 m)   Wt 137 lb (62.1 kg)   SpO2 98%   BMI 22.80 kg/m   Physical Exam   General appearance - alert, well appearing middle-age Caucasian male, and in no distress Mental status - normal mood, behavior, speech, dress, motor activity, and thought processes Chest -  clear to auscultation, no wheezes, rales or rhonchi, symmetric air entry Heart - normal rate, regular rhythm, normal S1, S2, no murmurs, rubs, clicks or gallops Abdomen - soft, nontender, nondistended, no masses or organomegaly Extremities - peripheral pulses normal, no pedal edema, no clubbing or cyanosis     Latest Ref Rng & Units 08/02/2022   10:55 AM 11/18/2021   11:49 AM 09/26/2021    2:34 PM  CMP  Glucose 70 - 99 mg/dL 97  366    BUN 6 - 20 mg/dL 10  13  Creatinine 0.76 - 1.27 mg/dL 2.13  0.86    Sodium 578 - 144 mmol/L 138  135    Potassium 3.5 - 5.2 mmol/L 4.8  4.3    Chloride 96 - 106 mmol/L 103  106    CO2 20 - 29 mmol/L 22  21    Calcium 8.7 - 10.2 mg/dL 9.8  46.9    Total Protein 6.0 - 8.5 g/dL 7.3   7.2   Total Bilirubin 0.0 - 1.2 mg/dL 1.3   1.4   Alkaline Phos 44 - 121 IU/L 240   164   AST 0 - 40 IU/L 31   31   ALT 0 - 44 IU/L 24   14    Lipid Panel  No results found for: "CHOL", "TRIG", "HDL", "CHOLHDL", "VLDL", "LDLCALC", "LDLDIRECT"  CBC    Component Value Date/Time   WBC 10.6 08/02/2022 1055   WBC 12.3 (H) 08/01/2021 1512   RBC 4.52 08/02/2022 1055   RBC 3.53 (L) 08/01/2021 1512   HGB 13.4 08/02/2022 1055   HCT 38.1 08/02/2022 1055   PLT 152 08/02/2022 1055   MCV 84 08/02/2022 1055   MCH 29.6 08/02/2022 1055   MCH 33.8 06/15/2021 0219   MCHC 35.2 08/02/2022 1055   MCHC 34.4 08/01/2021 1512   RDW 12.7 08/02/2022 1055   LYMPHSABS 1.7 09/26/2021 1434   MONOABS 1.1 (H) 08/01/2021 1512   EOSABS 0.3 09/26/2021 1434   BASOSABS 0.1 09/26/2021 1434    ASSESSMENT AND PLAN: 1. Alcoholic cirrhosis of liver without ascites (HCC) 2. Alcohol use disorder, severe, in sustained remission (HCC) Stable with no signs of decompensation.  Continue to abstain from alcohol.  Systolic blood pressure is low.  Advised patient that he can stop the spironolactone but restart if he develops any swelling of the abdomen to suggest ascites or swelling in the lower extremities.   Continue carvedilol and pantoprazole.  Low-salt diet.   -Repeat liver ultrasound due in December and order already in the system.  3. Erectile dysfunction, unspecified erectile dysfunction type Discussed putting him on Viagra.  I went over how the medication works and potential side effects. Pt advised of possible side effects of this medication including prolong erection, flushing, headaches, stuff nose and sudden vision and hearing changes.  Pt told to be seen in ER if he has erection lasting longer than 3-4 hrs, or if he has sudden vision changes or hearing loss. - sildenafil (VIAGRA) 50 MG tablet; 1 tab 1/2 - 1 hr prior to intercourse.  Limit use to 1 tab/24 hr  Dispense: 10 tablet; Refill: 3  Patient was given the opportunity to ask questions.  Patient verbalized understanding of the plan and was able to repeat key elements of the plan.   This documentation was completed using Paediatric nurse.  Any transcriptional errors are unintentional.  No orders of the defined types were placed in this encounter.    Requested Prescriptions   Signed Prescriptions Disp Refills   sildenafil (VIAGRA) 50 MG tablet 10 tablet 3    Sig: 1 tab 1/2 - 1 hr prior to intercourse.  Limit use to 1 tab/24 hr    Return in about 6 months (around 07/26/2023).  Jonah Blue, MD, FACP

## 2023-01-25 NOTE — Patient Instructions (Addendum)
Stop Spironolactone.  Sildenafil Tablets (Erectile Dysfunction) What is this medication? SILDENAFIL (sil DEN a fil) treats erectile dysfunction (ED). It works by increasing blood flow to the penis, which helps to maintain an erection. This medicine may be used for other purposes; ask your health care provider or pharmacist if you have questions. COMMON BRAND NAME(S): Viagra What should I tell my care team before I take this medication? They need to know if you have any of these conditions: Abnormal penis shape or Peyronie disease Bleeding disorders Eye or vision loss problems Heart disease Low blood pressure History of blood diseases, such as sickle cell anemia or leukemia History of painful and prolonged erection Pulmonary veno-occlusive disease (PVOD) Stomach ulcers, other stomach or intestine problems An unusual or allergic reaction to sildenafil, other medications, foods, dyes, or preservatives Pregnant or trying to get pregnant Breastfeeding How should I use this medication? Take this medication by mouth with a glass of water. Follow the directions on the prescription label. The dose is usually taken 1 hour before sexual activity. You should not take the dose more than once per day. Do not take your medication more often than directed. Talk to your care team about the use of this medication in children. This medication is not used in children for this condition. Overdosage: If you think you have taken too much of this medicine contact a poison control center or emergency room at once. NOTE: This medicine is only for you. Do not share this medicine with others. What if I miss a dose? This does not apply. Do not take double or extra doses. What may interact with this medication? Do not take this medication with any of the following: Nitrates, such as amyl nitrite, isosorbide dinitrate, isosorbide mononitrate, nitroglycerin Riociguat Vericiguat This medication may also interact  with the following: Bosentan Certain medications for blood pressure Other medications may affect the way this medication works. Talk with your care team about all of the medications you take. They may suggest changes to your treatment plan to lower the risk of side effects and to make sure your medications work as intended. This list may not describe all possible interactions. Give your health care provider a list of all the medicines, herbs, non-prescription drugs, or dietary supplements you use. Also tell them if you smoke, drink alcohol, or use illegal drugs. Some items may interact with your medicine. What should I watch for while using this medication? Visit your care team for regular checks on your progress. Tell your care team if your symptoms do not start to get better or if they get worse. Tell your care team right away if you have any change in your eyesight or hearing. This medication may affect your coordination, reaction time, or judgment. Do not drive or operate machinery until you know how this medication affects you. Sit up or stand slowly to reduce the risk of dizzy or fainting spells. Drinking alcohol with this medication can increase the risk of these side effects. Contact your care team right away if you have an erection that lasts longer than 4 hours or if it becomes painful. This may be a sign of a serious problem and must be treated right away to prevent permanent damage. If you experience symptoms of nausea, dizziness, chest pain or arm pain upon initiation of sexual activity after taking this medication, you should refrain from further activity and call your care team as soon as possible. Using this medication does not protect you or your  partner against HIV or other sexually transmitted infections (STIs). What side effects may I notice from receiving this medication? Side effects that you should report to your care team as soon as possible: Allergic reactions--skin rash,  itching, hives, swelling of the face, lips, tongue, or throat Hearing loss or ringing in ears Heart attack--pain or tightness in the chest, shoulders, arms, or jaw, nausea, shortness of breath, cold or clammy skin, feeling faint or lightheaded Heart rhythm changes--fast or irregular heartbeat, dizziness, feeling faint or lightheaded, chest pain, trouble breathing Low blood pressure--dizziness, feeling faint or lightheaded, blurry vision New or worsening shortness of breath Prolonged or painful erection Stroke--sudden numbness or weakness of the face, arm, or leg, trouble speaking, confusion, trouble walking, loss of balance or coordination, dizziness, severe headache, change in vision Sudden vision loss in one or both eyes Side effects that usually do not require medical attention (report to your care team if they continue or are bothersome): Facial flushing or redness Headache Nosebleed Runny or stuffy nose Trouble sleeping Upset stomach This list may not describe all possible side effects. Call your doctor for medical advice about side effects. You may report side effects to FDA at 1-800-FDA-1088. Where should I keep my medication? Keep out of reach of children and pets. Store at room temperature between 15 and 30 degrees C (59 and 86 degrees F). Throw away any unused medication after the expiration date. NOTE: This sheet is a summary. It may not cover all possible information. If you have questions about this medicine, talk to your doctor, pharmacist, or health care provider.  2024 Elsevier/Gold Standard (2022-07-07 00:00:00)

## 2023-02-08 ENCOUNTER — Ambulatory Visit (HOSPITAL_COMMUNITY): Payer: Self-pay

## 2023-03-12 ENCOUNTER — Telehealth: Payer: Self-pay

## 2023-03-12 DIAGNOSIS — I8500 Esophageal varices without bleeding: Secondary | ICD-10-CM

## 2023-03-12 DIAGNOSIS — D649 Anemia, unspecified: Secondary | ICD-10-CM

## 2023-03-12 DIAGNOSIS — K7030 Alcoholic cirrhosis of liver without ascites: Secondary | ICD-10-CM

## 2023-03-12 NOTE — Telephone Encounter (Signed)
-----   Message from [facility] [place] [facility] sent at 10/11/2022 11:53 AM EDT ----- Regarding: labs and complete U/S due in December Patient with cirrhosis due for labs (cbc, cmet, afp and Inr) in December and  Complete abdominal U/S   Do before his 6 month F/U appointment in December

## 2023-03-14 NOTE — Telephone Encounter (Signed)
Letter mailed to patient.

## 2023-03-20 ENCOUNTER — Ambulatory Visit (HOSPITAL_COMMUNITY): Admission: RE | Admit: 2023-03-20 | Payer: Self-pay | Source: Ambulatory Visit

## 2023-03-26 NOTE — Telephone Encounter (Signed)
Patient cancelled appointment.  Message sent to schedulers to call patient to see if he would like to reschedule.

## 2023-04-04 ENCOUNTER — Ambulatory Visit: Payer: Self-pay | Admitting: Gastroenterology

## 2023-04-04 NOTE — Progress Notes (Unsigned)
HPI : seen June 2024 42 year old male here for a follow-up visit for alcoholic cirrhosis. Last seen 10/2021   Recall he dx February 2023 when he presented with alcoholic hepatitis with a bilirubin of 24.5, elevations in his transaminases with leukocytosis.  Imaging with CT scan showed changes of cirrhosis with portal hypertension and varices.  He had mild ascites at the time.  Discriminant function met criteria for steroids at the time and prednisone was started.  Over his course of it it did not seem to improve his numbers significantly within the first few weeks it was discontinued. He had a full serologic work-up which showed no cause of cirrhosis otherwise.  His ferritin was elevated thought to be acute inflammatory reactant, he tested negative for genetic testing for hemochromatosis.  He previously drank at least 3 beers per day and 5 shots of hard liquor at night.  This was ongoing for longstanding time.  He has been complete alcohol free since the hospitalization in February 2023.  Otherwise has been vaccinated hepatitis A and B. He does have a history of esophageal varices on Coreg.   He has been doing really well since have last seen him.  Continues to be completely abstinent from alcohol.  He has not had any decompensations.  He is compliant with his Coreg.  He previously was on Lasix and Aldactone for edema.  He states he had the Lasix stopped about 3 months ago and has not noticed any difference since coming off of it.  He denies any lower extremity edema or swelling in his abdomen.  He does have a varicosity in his periumbilical area which has been stable over time and it does not hurt him.  He had a right upper quadrant ultrasound in April for [facility] screening which was negative for hepatomas, stable changes of cirrhosis.  His EGD was in April 2023 showing varices, he has been on Coreg since that time and tolerates it okay.  Otherwise has a history of GERD, previously controlled on Protonix 40 mg  once daily.  He states he ran out of this and has been using omeprazole over-the-counter at home and has been working fairly well for him.  He denies any breakthrough symptoms.  Prior EGD showed no Barrett's.  He has no complaints today.  CBC, CMET done in April.  He has not had an INR in some time.  Due for AFP as well.   Prior workup: [place] 06/11/21: IMPRESSION: 1. Gallbladder wall thickening in the absence of cholelithiasis. 2. Hepatic steatosis and hepatomegaly with additional findings suggestive of hepatic cirrhosis. 3. No flow within the main portal vein which may represent sequelae associated with extensive portal hypertension. Correlation with contrast-enhanced abdomen pelvis CT is recommended, as portal vein thrombosis cannot be excluded. 4. Perihepatic ascites which may represent the source of the previously noted gallbladder wall thickening.   CT abdomen / pelvis 06/11/21: IMPRESSION: 1. Morphologic features of the liver compatible with cirrhosis. There is a hepatic steatosis and marked hepatomegaly. Confluent fibrosis of the medial segment of left hepatic lobe noted. 2. Stigmata of portal venous hypertension with recanalized umbilical vein, small esophageal and gastric varices, and small volume of ascites. 3. Mild circumferential bowel wall edema involving the proximal colon with marked edema involving the mid and distal sigmoid colon and rectum. Findings are nonspecific and may reflect underlying colitis versus hepatic colopathy 4. Aortic Atherosclerosis (ICD10-I70.0).   [place] 06/12/21: IMPRESSION: Trace perihepatic ascites. Paracentesis not performed due to the small amount  of fluid.   EGD 08/17/21: - A 1 cm hiatal hernia was present. - Varices were found in the lower third of the esophagus. They were small to medium in size without any high risk stigmata for bleeding. - The exam of the esophagus was otherwise normal. 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 Biopsies were taken with a cold forceps for Helicobacter pylori testing. - The exam of the stomach was otherwise normal. No gastric varices. - A single diminutive sessile polyp was found in the second portion of the duodenum. The polyp was removed with a cold biopsy forceps. Resection and retrieval were complete. - The exam of the duodenum was otherwise normal.   Colonoscopy 08/17/21: The perianal and digital rectal examinations were normal. - Multiple small-mouthed diverticula were found in the sigmoid colon with superficial erythema in the sigmoid colon. - A 3 mm polyp was found in the recto-sigmoid colon. The polyp was sessile. The polyp was removed with a cold snare. Resection and retrieval were complete. - Internal hemorrhoids were found during retroflexion. The hemorrhoids were small. - The exam was otherwise without abnormality.   1. Surgical [P], duodenal polyp, polyp (1) POLYPOID FRAGMENTS OF DUODENAL MUCOSA. THERE ARE NO DIAGNOSTIC FEATURES OF CELIAC DISEASE. NEGATIVE FOR ADENOMATOUS CHANGE AND NEOPLASM. 2. Surgical [P], gastric antrum and gastric body FRAGMENTS OF GASTRIC MUCOSA WITH NO SIGNIFICANT MICROSCOPIC ABNORMALITIES. H. PYLORI, INTESTINAL METAPLASIA, ATROPHY AND DYSPLASIA ARE NOT IDENTIFIED. 3. Surgical [P], colon, recto-sigmoid, polyp (1) HYPERPLASTIC POLYP. NEGATIVE FOR DYSPLASIA.   RUQ [place] 08/02/22: IMPRESSION: 1. Cirrhotic morphology of the liver. No focal lesion identified. 2. Recannulated periumbilical vein.   42 y.o. male here for assessment of the following   1. Alcoholic cirrhosis, unspecified whether ascites present (HCC)   2. Esophageal varices without bleeding, unspecified esophageal varices type (HCC)   3. Gastroesophageal reflux disease, unspecified whether esophagitis present     As above presented with acute alcoholic hepatitis on top of decompensated cirrhosis during hospitalization in February 2023.  He has been  completely abstinent from alcohol since that time and doing really well.  He does have a history of varices but no bleeding, compliant with Coreg.  He has a history of ascites and lower extremity edema, previously on Lasix and Aldactone, has been able to come off Lasix without any recurrent edema.  We discussed options, he can try stopping the Aldactone and see how things go.  If he notices recurrence of edema/fluid he can resume it.  He wants to try coming off of it and would stay on a low-sodium diet.  He is due for an AFP and INR today to upgrade his MELD score and for [facility] screening his right upper quadrant ultrasound is up-to-date, will repeat that again in 6 months.  Discussed cirrhosis in general, risks for decompensation and HCC over time, he understands.  Critically important that he maintain complete abstinence from alcohol and he is agreeable to do this.  Will continue to see him every 6 months   PLAN: - doing a  great job with alcohol abstinence, continue - stop aldactone - see if he needs this long term, if recurrent swelling / ascites recurs then resume - stay on low Na diet - continue Coreg - lab today for AFP and INR - will need repeat labs - CBC, CMET, INR, AFP in December - recall abdominal US in December - refill protonix but at lower dose, 20mg  / day. Can increase to 40mg   if needed. counseled on long term use / risks of PPI - f/u 6 months or sooner with issues   Due for RUQ [place] Due for labs      Past Medical History:  Diagnosis Date   Alcohol dependence (HCC)    Cirrhosis, alcoholic (HCC)    Esophageal varices (HCC)    Tobacco dependence      Past Surgical History:  Procedure Laterality Date   IR PARACENTESIS  07/21/2021   NO PAST SURGERIES     Family History  Problem Relation Age of Onset   Diabetes Mother    Diabetes Father    Diabetes Maternal Aunt    Diabetes Maternal Uncle    Diabetes Maternal Grandmother    Colon cancer Neg Hx    Colon polyps Neg Hx     Esophageal cancer Neg Hx    Pancreatic cancer Neg Hx    Stomach cancer Neg Hx    Social History   Tobacco Use   Smoking status: Every Day    Current packs/day: 0.50    Types: Cigarettes   Smokeless tobacco: Never  Vaping Use   Vaping status: Former  Substance Use Topics   Alcohol use: Not Currently    Alcohol/week: 42.0 standard drinks of alcohol    Types: 7 Cans of beer, 35 Shots of liquor per week    Comment: drinks every day, at least 1 beer and 5 shots of liquor a day with other days he binges   Drug use: Yes    Types: Marijuana   Current Outpatient Medications  Medication Sig Dispense Refill   carvedilol (COREG) 3.125 MG tablet Take 1 tablet (3.125 mg total) by mouth 2 (two) times daily with a meal. 60 tablet 6   folic acid (FOLVITE) 1 MG tablet Take 1 tablet (1 mg total) by mouth daily. (Patient not taking: Reported on 01/25/2023) 90 tablet 1   Magnesium Hydroxide (DULCOLAX SOFT CHEWS PO) Take 1 tablet by mouth as needed.     Multiple Vitamin (MULTIVITAMIN) tablet Take 1 tablet by mouth daily.     pantoprazole (PROTONIX) 20 MG tablet Take 1 tablet (20 mg total) by mouth daily. 90 tablet 3   sildenafil (VIAGRA) 50 MG tablet 1 tab 1/2 - 1 hr prior to intercourse.  Limit use to 1 tab/24 hr 10 tablet 3   No current facility-administered medications for this visit.   Allergies  Allergen Reactions   Milk-Related Compounds    Penicillins    Shellfish Allergy     Other reaction(s): Other (See Comments) Rash      Review of Systems: All systems reviewed and negative except where noted in HPI.    No results found.  Physical Exam: There were no vitals taken for this visit. Constitutional: Pleasant,well-developed, ***male in no acute distress. HEENT: Normocephalic and atraumatic. Conjunctivae are normal. No scleral icterus. Neck supple.  Cardiovascular: Normal rate, regular rhythm.  Pulmonary/chest: Effort normal and breath sounds normal. No wheezing, rales or  rhonchi. Abdominal: Soft, nondistended, nontender. Bowel sounds active throughout. There are no masses palpable. No hepatomegaly. Extremities: no edema Lymphadenopathy: No cervical adenopathy noted. Neurological: Alert and oriented to person place and time. Skin: Skin is warm and dry. No rashes noted. Psychiatric: Normal mood and affect. Behavior is normal.   ASSESSMENT: 42 y.o. male here for assessment of the following  No diagnosis found.  PLAN:   Marcine Matar, MD

## 2023-04-30 ENCOUNTER — Encounter (HOSPITAL_COMMUNITY): Payer: Self-pay

## 2023-04-30 ENCOUNTER — Other Ambulatory Visit (HOSPITAL_COMMUNITY): Payer: Self-pay

## 2023-07-26 ENCOUNTER — Ambulatory Visit: Payer: 59 | Admitting: Internal Medicine

## 2023-08-07 ENCOUNTER — Ambulatory Visit: Payer: 59 | Admitting: Internal Medicine

## 2023-10-17 IMAGING — US US ABDOMEN LIMITED
1 series · 4 of 4 positions shown · non-contrast
Comparison: CT 06/11/2021

CLINICAL DATA: Ascites.  Evaluate for paracentesis.

EXAM:
LIMITED ABDOMEN ULTRASOUND FOR ASCITES
TECHNIQUE: Limited ultrasound survey for ascites was performed in all four
abdominal quadrants.

[Series 1: us paracentesis · 4 of 4 slices shown]
[im 1/4]
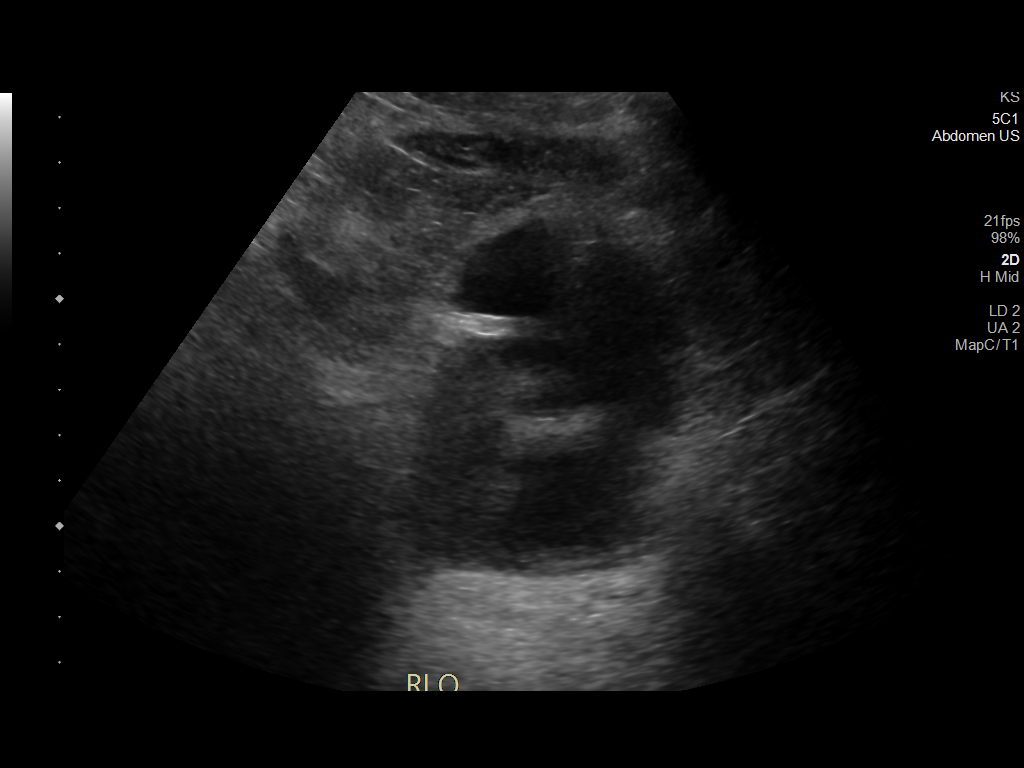
[im 2/4]
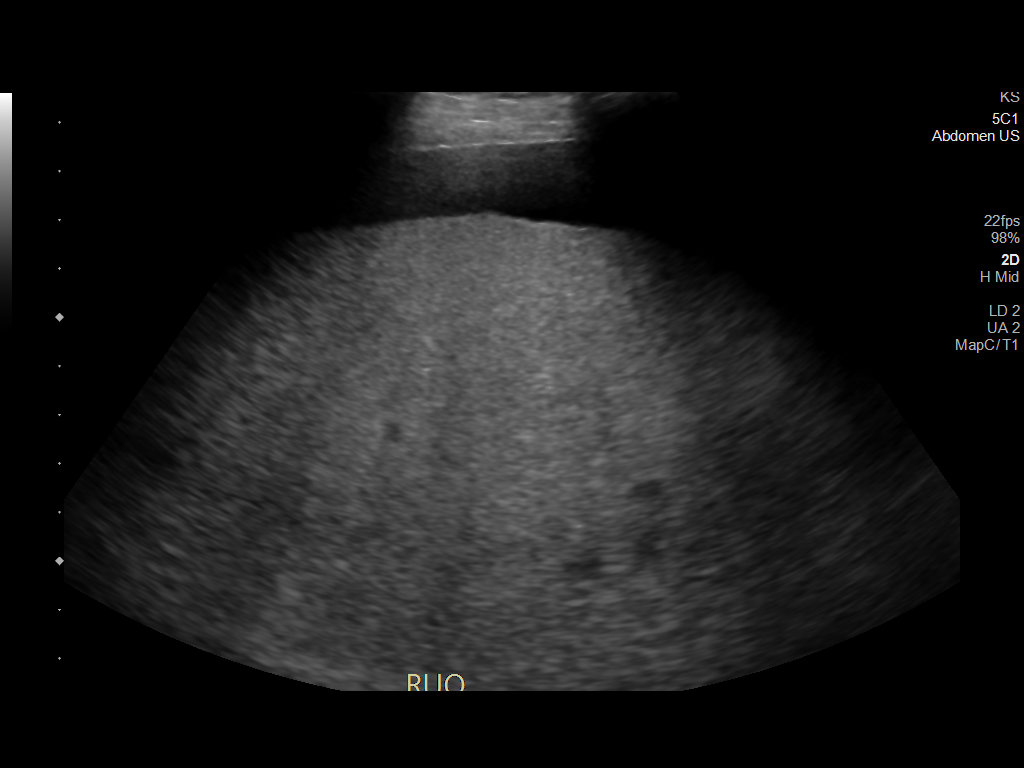
[im 3/4]
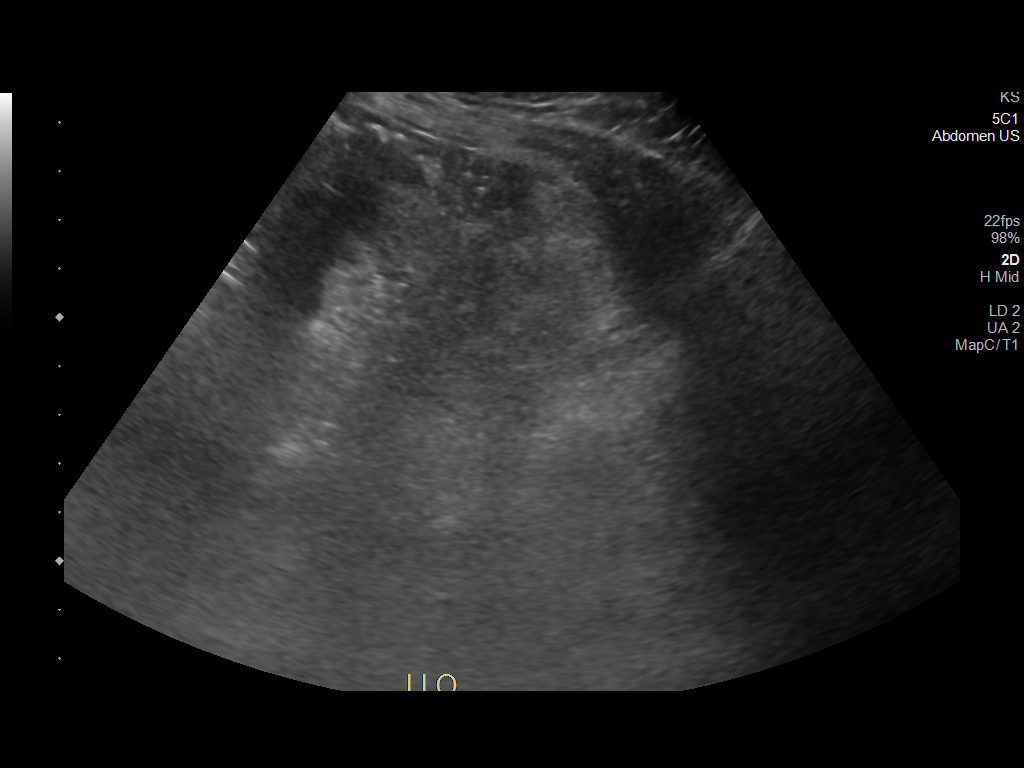
[im 4/4]
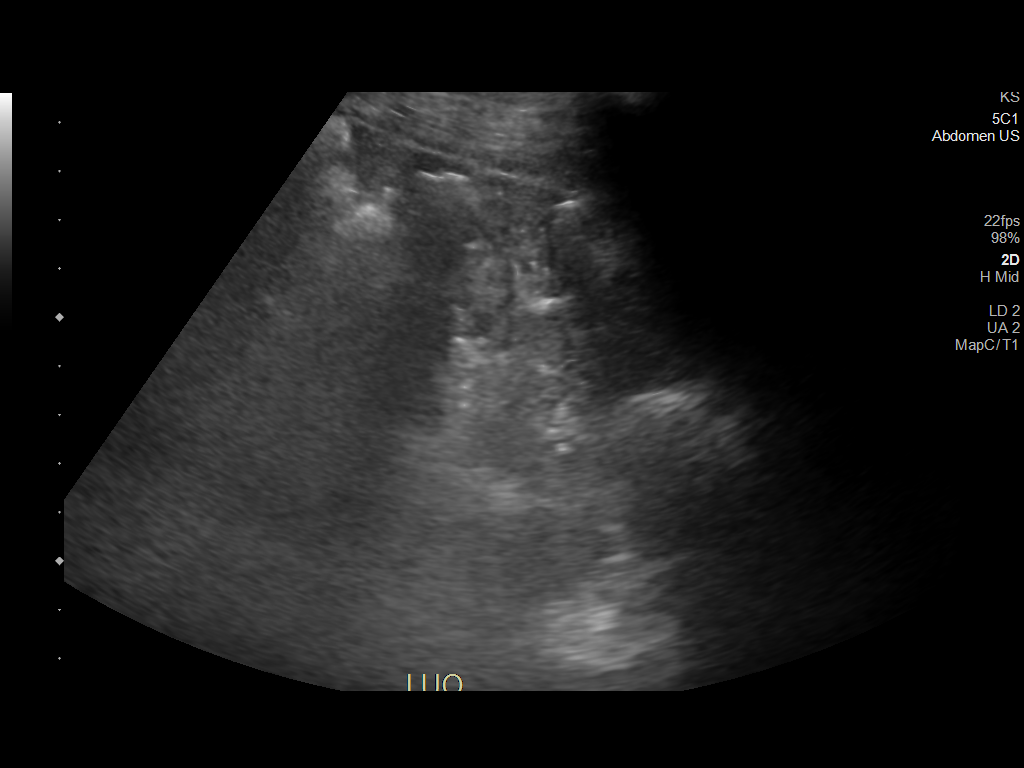

[4 of 4 positions shown; findings below may reference images not displayed]

FINDINGS: Trace perihepatic ascites. No significant ascites in the lower
quadrants. No significant ascites in the left upper quadrant.
IMPRESSION: Trace perihepatic ascites. Paracentesis not performed due to the
small amount of fluid.

## 2023-11-25 IMAGING — US IR PARACENTESIS
1 series · 2 of 2 positions shown · non-contrast
Comparison: none

INDICATION: History of alcoholic cirrhosis with ascites. Request for therapeutic
paracentesis up to 4 L max

[Series 1: ir (person_name)/(person_name) · 2 of 2 slices shown]
[im 1/2]
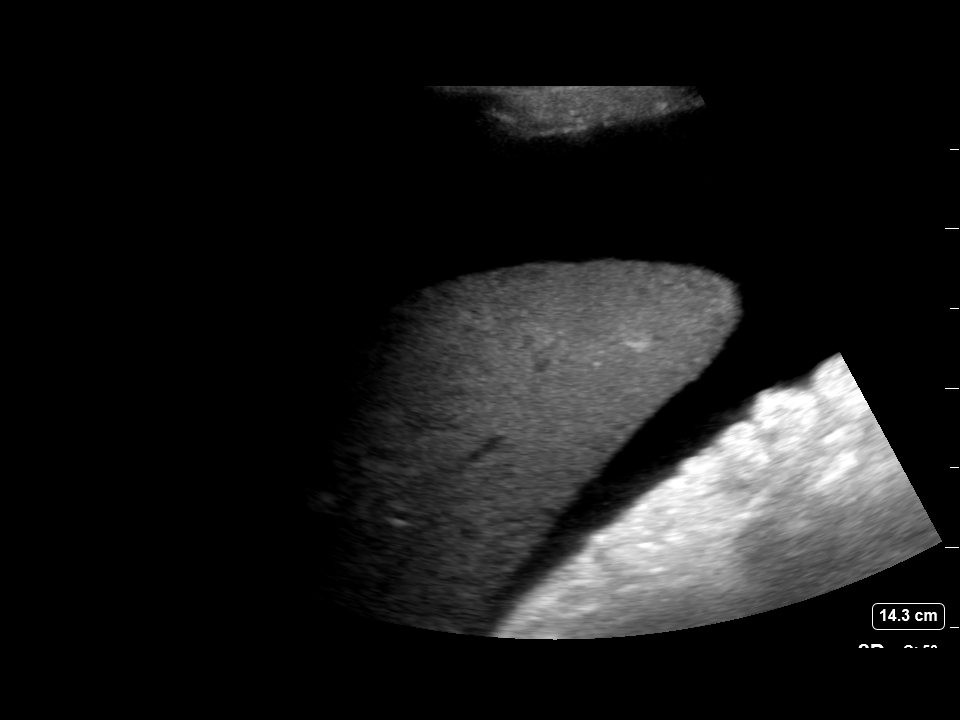
[im 2/2]
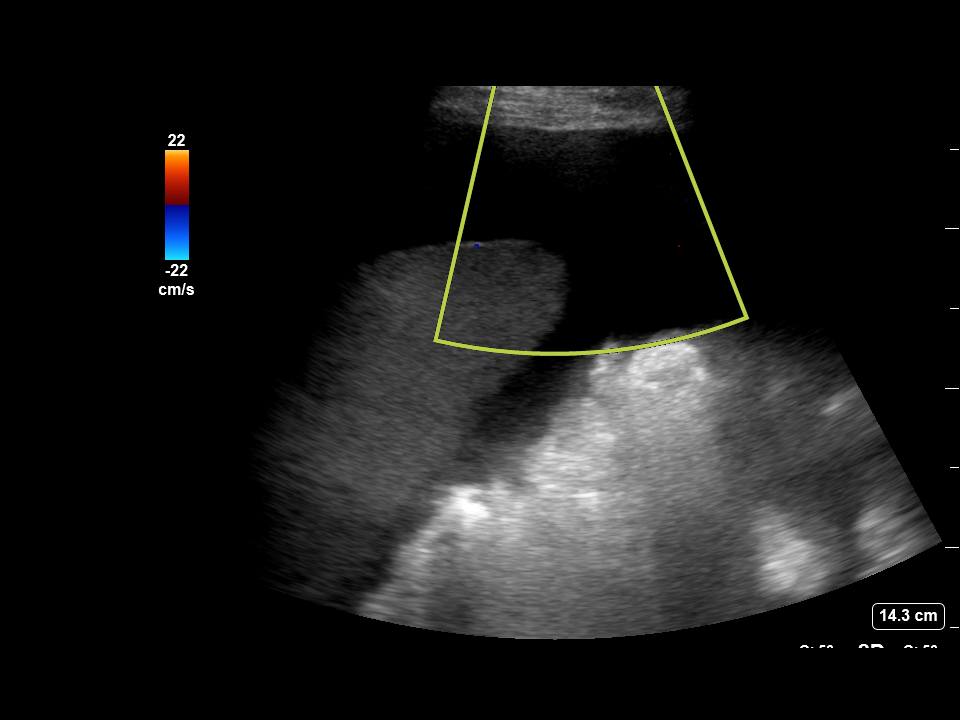

[2 of 2 positions shown; findings below may reference images not displayed]

EXAM:
ULTRASOUND GUIDED RIGHT LOWER QUADRANT PARACENTESIS

MEDICATIONS:
1% plain lidocaine, 5

COMPLICATIONS:
None immediate.

PROCEDURE:
Informed written consent was obtained from the patient after a
discussion of the risks, benefits and alternatives to treatment. A
timeout was performed prior to the initiation of the procedure.

Initial ultrasound scanning demonstrates a large amount of ascites
within the right lower abdominal quadrant. The right lower abdomen
was prepped and draped in the usual sterile fashion. 1% lidocaine
was used for local anesthesia.

Following this, a 19 gauge, 7-cm, Yueh catheter was introduced. An
ultrasound image was saved for documentation purposes. The
paracentesis was performed. The catheter was removed and a dressing
was applied. The patient tolerated the procedure well without
immediate post procedural complication.
FINDINGS: A total of approximately 3.8 L of clear yellow fluid was removed.
IMPRESSION: Successful ultrasound-guided paracentesis yielding 3.8 L of
peritoneal fluid.

PLAN:
If the patient eventually requires >/=2 paracenteses in a 30 day
period, candidacy for formal evaluation by the [HOSPITAL]
assessed.
